# Patient Record
Sex: Male | Born: 1960
Health system: Southern US, Community
[De-identification: ages and names within clinical notes are randomized; demographics above are authoritative.]

## PROBLEM LIST (undated history)

## (undated) DIAGNOSIS — G459 Transient cerebral ischemic attack, unspecified: Secondary | ICD-10-CM

## (undated) DIAGNOSIS — N411 Chronic prostatitis: Secondary | ICD-10-CM

## (undated) DIAGNOSIS — G43909 Migraine, unspecified, not intractable, without status migrainosus: Secondary | ICD-10-CM

## (undated) HISTORY — PX: CHOLECYSTECTOMY: SHX55

## (undated) HISTORY — DX: Chronic prostatitis: N41.1

## (undated) HISTORY — PX: ANTERIOR CRUCIATE LIGAMENT REPAIR: SHX115

## (undated) HISTORY — DX: Transient cerebral ischemic attack, unspecified: G45.9

---

## 2009-06-03 ENCOUNTER — Ambulatory Visit: Payer: Self-pay | Admitting: Family Medicine

## 2009-10-27 ENCOUNTER — Ambulatory Visit: Payer: Self-pay | Admitting: Gastroenterology

## 2009-10-31 LAB — PATHOLOGY REPORT

## 2012-07-04 ENCOUNTER — Ambulatory Visit: Payer: Self-pay | Admitting: Urology

## 2014-06-23 ENCOUNTER — Ambulatory Visit
Admit: 2014-06-23 | Disposition: A | Discharge status: Home / Self Care | Payer: Self-pay | Attending: Family Medicine | Admitting: Family Medicine

## 2014-11-01 ENCOUNTER — Ambulatory Visit (INDEPENDENT_AMBULATORY_CARE_PROVIDER_SITE_OTHER): Payer: BLUE CROSS/BLUE SHIELD | Admitting: Family Medicine

## 2014-11-01 ENCOUNTER — Encounter: Payer: Self-pay | Admitting: Family Medicine

## 2014-11-01 VITALS — BP 121/75 | HR 63 | Temp 97.9°F | Ht 71.0 in | Wt 187.0 lb

## 2014-11-01 DIAGNOSIS — N411 Chronic prostatitis: Secondary | ICD-10-CM

## 2014-11-01 DIAGNOSIS — Z0000 Encounter for general adult medical examination without abnormal findings: Secondary | ICD-10-CM | POA: Diagnosis not present

## 2014-11-01 MED ORDER — VARDENAFIL HCL 20 MG PO TABS: 20.0000 mg | ORAL_TABLET | Freq: Every day | ORAL | Status: DC | PRN

## 2014-11-01 MED ORDER — FINASTERIDE 5 MG PO TABS: 5.0000 mg | ORAL_TABLET | Freq: Every day | ORAL | Status: DC

## 2014-11-01 NOTE — Assessment & Plan Note (Signed)
The current medical regimen is effective;  continue present plan and medications.  

## 2014-11-01 NOTE — Progress Notes (Signed)
   BP 121/75 mmHg  Pulse 63  Temp(Src) 97.9 F (36.6 C)  Ht 5\' 11"  (1.803 m)  Wt 187 lb (84.823 kg)  BMI 26.09 kg/m2  SpO2 99%   Subjective:    Patient ID: Geoffrey Ramirez, male    DOB: Apr 30, 1960, 54 y.o.   MRN: [id number]  HPI: Geoffrey Ramirez is a 54 y.o. male  Chief Complaint  Patient presents with  . Annual Exam   patient for physical has several concerns Concerned about leg circulation symptoms being cold and hair loss. This is been ongoing over the years. Noticed no claudication symptoms or any other symptoms with exertion.  Also has some word finding issues specifically with names. Has otherwise done well with no work problems or deficiencies.  Relevant past medical, surgical, family and social history reviewed and updated as indicated. Interim medical history since our last visit reviewed. Allergies and medications reviewed and updated.  Review of Systems  Constitutional: Negative.   HENT: Negative.   Eyes: Negative.   Respiratory: Negative.   Cardiovascular: Negative.   Endocrine: Negative.   Genitourinary: Negative for decreased urine volume.  Musculoskeletal: Negative.   Skin: Negative.   Allergic/Immunologic: Negative.   Neurological: Negative.   Hematological: Negative.   Psychiatric/Behavioral: Negative.     Per HPI unless specifically indicated above     Objective:    BP 121/75 mmHg  Pulse 63  Temp(Src) 97.9 F (36.6 C)  Ht 5\' 11"  (1.803 m)  Wt 187 lb (84.823 kg)  BMI 26.09 kg/m2  SpO2 99%  Wt Readings from Last 3 Encounters:  11/01/14 187 lb (84.823 kg)  10/27/13 185 lb (83.915 kg)    Physical Exam  Constitutional: He is oriented to person, place, and time. He appears well-developed and well-nourished.  HENT:  Head: Normocephalic and atraumatic.  Right Ear: External ear normal.  Left Ear: External ear normal.  Eyes: Conjunctivae and EOM are normal. Pupils are equal, round, and reactive to light.  Neck: Normal range of motion. Neck  supple.  Cardiovascular: Normal rate, regular rhythm, normal heart sounds and intact distal pulses.   Pulmonary/Chest: Effort normal and breath sounds normal.  Abdominal: Soft. Bowel sounds are normal. There is no splenomegaly or hepatomegaly.  Genitourinary: Rectum normal, prostate normal and penis normal.  Musculoskeletal: Normal range of motion.  Neurological: He is alert and oriented to person, place, and time. He has normal reflexes.  Skin: No rash noted. No erythema.  Psychiatric: He has a normal mood and affect. His behavior is normal. Judgment and thought content normal.        Assessment & Plan:   Problem List Items Addressed This Visit      Genitourinary   Chronic prostatitis    The current medical regimen is effective;  continue present plan and medications.        Other Visit Diagnoses    PE (physical exam), annual    -  Primary      discuss memory issues and observing mostly full plate issues. Leg exam normal with normal pulses will observe symptoms if develops any claudication issues or other changes will further evaluate Reviewed lab work done at work which was normal.  Follow up plan: Return in about 1 year (around 11/01/2015), or if symptoms worsen or fail to improve, for Physical Exam.

## 2014-12-03 ENCOUNTER — Other Ambulatory Visit: Payer: Self-pay | Admitting: Family Medicine

## 2015-07-04 ENCOUNTER — Ambulatory Visit (INDEPENDENT_AMBULATORY_CARE_PROVIDER_SITE_OTHER): Payer: BLUE CROSS/BLUE SHIELD | Admitting: Family Medicine

## 2015-07-04 ENCOUNTER — Encounter: Payer: Self-pay | Admitting: Family Medicine

## 2015-07-04 VITALS — BP 117/84 | HR 61 | Temp 98.8°F | Ht 70.8 in | Wt 185.0 lb

## 2015-07-04 DIAGNOSIS — R079 Chest pain, unspecified: Secondary | ICD-10-CM

## 2015-07-04 NOTE — Progress Notes (Signed)
   BP 117/84 mmHg  Pulse 61  Temp(Src) 98.8 F (37.1 C)  Ht 5' 10.8" (1.798 m)  Wt 185 lb (83.915 kg)  BMI 25.96 kg/m2  SpO2 98%   Subjective:    Patient ID: Geoffrey Ramirez, male    DOB: 1960/12/21, 55 y.o.   MRN: [id number]  HPI: Geoffrey Ramirez is a 55 y.o. male  Chief Complaint  Patient presents with  . chest discomfort    started earlier last week, comes and goes  Patient with some nonspecific chest pain mostly at rest will come and go into the left neck left arm area no nausea vomiting diaphoresis associated did feel a little warm with this not necessarily associated with exertion Plan nurse checking after one of these spells blood pressure was good with no obvious cardiovascular changes or issues. Medication stable with no prostate issues on medications Reviewed cholesterol from last visit in August was essentially normal family history of coronary artery disease and grand father had a heart attack in his late 70s. No other real risk factors. Patient exercises on a regular basis on a treadmill 20-30 minutes a day with no cardiovascular symptoms.  Relevant past medical, surgical, family and social history reviewed and updated as indicated. Interim medical history since our last visit reviewed. Allergies and medications reviewed and updated.  Review of Systems  Constitutional: Negative.   Respiratory: Negative.   Cardiovascular: Positive for chest pain. Negative for palpitations and leg swelling.  Gastrointestinal: Negative.     Per HPI unless specifically indicated above     Objective:    BP 117/84 mmHg  Pulse 61  Temp(Src) 98.8 F (37.1 C)  Ht 5' 10.8" (1.798 m)  Wt 185 lb (83.915 kg)  BMI 25.96 kg/m2  SpO2 98%  Wt Readings from Last 3 Encounters:  07/04/15 185 lb (83.915 kg)  11/01/14 187 lb (84.823 kg)  10/27/13 185 lb (83.915 kg)    Physical Exam  Constitutional: He is oriented to person, place, and time. He appears well-developed and well-nourished. No  distress.  HENT:  Head: Normocephalic and atraumatic.  Right Ear: Hearing normal.  Left Ear: Hearing normal.  Nose: Nose normal.  Eyes: Conjunctivae and lids are normal. Right eye exhibits no discharge. Left eye exhibits no discharge. No scleral icterus.  Cardiovascular: Normal rate, regular rhythm and normal heart sounds.   Pulmonary/Chest: Effort normal and breath sounds normal. No respiratory distress.  Musculoskeletal: Normal range of motion.  Neurological: He is alert and oriented to person, place, and time.  Skin: Skin is intact. No rash noted.  Psychiatric: He has a normal mood and affect. His speech is normal and behavior is normal. Judgment and thought content normal. Cognition and memory are normal.        Assessment & Plan:   Problem List Items Addressed This Visit    None    Visit Diagnoses    Chest pain, unspecified chest pain type    -  Primary    Reviewed chest pain, with modest risk factors, bundle branch block, will refer to cardiology to further evaluate    Relevant Orders    EKG 12-Lead (Completed)    Ambulatory referral to Cardiology    Comprehensive metabolic panel    Lipid panel    CBC with Differential/Platelet    TSH        Follow up plan: Return for As scheduled.

## 2015-07-05 ENCOUNTER — Encounter: Payer: Self-pay | Admitting: Family Medicine

## 2015-07-05 LAB — LIPID PANEL
CHOLESTEROL TOTAL: 188 mg/dL (ref 100–199)
Chol/HDL Ratio: 3.5 ratio units (ref 0.0–5.0)
HDL: 54 mg/dL (ref >39)
LDL Calculated: 112 mg/dL — ABNORMAL HIGH (ref 0–99)
Triglycerides: 112 mg/dL (ref 0–149)
VLDL CHOLESTEROL CAL: 22 mg/dL (ref 5–40)

## 2015-07-05 LAB — COMPREHENSIVE METABOLIC PANEL
ALK PHOS: 58 IU/L (ref 39–117)
ALT: 18 IU/L (ref 0–44)
AST: 14 IU/L (ref 0–40)
Albumin/Globulin Ratio: 1.7 (ref 1.2–2.2)
Albumin: 4.3 g/dL (ref 3.5–5.5)
BUN/Creatinine Ratio: 20 (ref 9–20)
BUN: 16 mg/dL (ref 6–24)
Bilirubin Total: 0.5 mg/dL (ref 0.0–1.2)
CALCIUM: 9.1 mg/dL (ref 8.7–10.2)
CHLORIDE: 102 mmol/L (ref 96–106)
CO2: 24 mmol/L (ref 18–29)
Creatinine, Ser: 0.82 mg/dL (ref 0.76–1.27)
GFR calc Af Amer: 113 mL/min/{1.73_m2} (ref >59)
GFR calc non Af Amer: 98 mL/min/{1.73_m2} (ref >59)
GLUCOSE: 125 mg/dL — AB (ref 65–99)
Globulin, Total: 2.5 g/dL (ref 1.5–4.5)
POTASSIUM: 4.4 mmol/L (ref 3.5–5.2)
Sodium: 141 mmol/L (ref 134–144)
Total Protein: 6.8 g/dL (ref 6.0–8.5)

## 2015-07-05 LAB — CBC WITH DIFFERENTIAL/PLATELET
BASOS: 0 %
Basophils Absolute: 0 10*3/uL (ref 0.0–0.2)
EOS (ABSOLUTE): 0 10*3/uL (ref 0.0–0.4)
EOS: 0 %
HEMATOCRIT: 48.8 % (ref 37.5–51.0)
HEMOGLOBIN: 17 g/dL (ref 12.6–17.7)
Immature Grans (Abs): 0 10*3/uL (ref 0.0–0.1)
Immature Granulocytes: 0 %
Lymphocytes Absolute: 1.9 10*3/uL (ref 0.7–3.1)
Lymphs: 24 %
MCH: 31.1 pg (ref 26.6–33.0)
MCHC: 34.8 g/dL (ref 31.5–35.7)
MCV: 89 fL (ref 79–97)
MONOCYTES: 4 %
MONOS ABS: 0.4 10*3/uL (ref 0.1–0.9)
NEUTROS PCT: 72 %
Neutrophils Absolute: 5.8 10*3/uL (ref 1.4–7.0)
Platelets: 202 10*3/uL (ref 150–379)
RBC: 5.47 x10E6/uL (ref 4.14–5.80)
RDW: 13.4 % (ref 12.3–15.4)
WBC: 8.2 10*3/uL (ref 3.4–10.8)

## 2015-07-05 LAB — TSH: TSH: 1.16 u[IU]/mL (ref 0.450–4.500)

## 2015-07-11 DIAGNOSIS — R079 Chest pain, unspecified: Secondary | ICD-10-CM | POA: Diagnosis not present

## 2015-07-27 DIAGNOSIS — R079 Chest pain, unspecified: Secondary | ICD-10-CM | POA: Diagnosis not present

## 2015-07-27 DIAGNOSIS — E782 Mixed hyperlipidemia: Secondary | ICD-10-CM | POA: Diagnosis not present

## 2015-09-04 ENCOUNTER — Other Ambulatory Visit: Payer: Self-pay | Admitting: Family Medicine

## 2015-10-04 ENCOUNTER — Encounter (INDEPENDENT_AMBULATORY_CARE_PROVIDER_SITE_OTHER): Payer: Self-pay

## 2015-10-24 DIAGNOSIS — D225 Melanocytic nevi of trunk: Secondary | ICD-10-CM | POA: Diagnosis not present

## 2015-11-02 ENCOUNTER — Ambulatory Visit (INDEPENDENT_AMBULATORY_CARE_PROVIDER_SITE_OTHER): Payer: BLUE CROSS/BLUE SHIELD | Admitting: Family Medicine

## 2015-11-02 ENCOUNTER — Encounter: Payer: Self-pay | Admitting: Family Medicine

## 2015-11-02 VITALS — BP 118/75 | HR 67 | Temp 97.9°F | Ht 71.5 in | Wt 187.0 lb

## 2015-11-02 DIAGNOSIS — Z0000 Encounter for general adult medical examination without abnormal findings: Secondary | ICD-10-CM | POA: Diagnosis not present

## 2015-11-02 MED ORDER — VARDENAFIL HCL 20 MG PO TABS: 20.0000 mg | ORAL_TABLET | Freq: Every day | ORAL | 12 refills | Status: DC | PRN

## 2015-11-02 MED ORDER — FINASTERIDE 5 MG PO TABS: 5.0000 mg | ORAL_TABLET | Freq: Every day | ORAL | 12 refills | Status: DC

## 2015-11-02 NOTE — Progress Notes (Signed)
BP 118/75 (BP Location: Left Arm, Patient Position: Sitting, Cuff Size: Normal)   Pulse 67   Temp 97.9 F (36.6 C)   Ht 5' 11.5" (1.816 m)   Wt 187 lb (84.8 kg)   SpO2 97%   BMI 25.72 kg/m    Subjective:    Patient ID: Geoffrey Ramirez, male    DOB: [birth year], 55 y.o.   MRN: [id number]  HPI: Geoffrey Ramirez is a 55 y.o. male  Chief Complaint  Patient presents with  . Annual Exam   Patient all in all doing well having some issues with especially Sunday night waking up at 3 AM with his mind spinning and can go back to sleep. Also having some time early right heel somewhat in his left heel also first step in the morning pain gets a little better during the day but often constantly is uncomfortable. But more uncomfortable with walking especially after rest. Prostate no specific complaints doing well no change.  Relevant past medical, surgical, family and social history reviewed and updated as indicated. Interim medical history since our last visit reviewed. Allergies and medications reviewed and updated.  Review of Systems  Constitutional: Negative.   HENT: Negative.   Eyes: Negative.   Respiratory: Negative.   Cardiovascular: Negative.   Gastrointestinal: Negative.   Endocrine: Negative.   Genitourinary: Negative.   Musculoskeletal: Negative.   Skin: Negative.   Allergic/Immunologic: Negative.   Neurological: Negative.   Hematological: Negative.   Psychiatric/Behavioral: Negative.     Per HPI unless specifically indicated above     Objective:    BP 118/75 (BP Location: Left Arm, Patient Position: Sitting, Cuff Size: Normal)   Pulse 67   Temp 97.9 F (36.6 C)   Ht 5' 11.5" (1.816 m)   Wt 187 lb (84.8 kg)   SpO2 97%   BMI 25.72 kg/m   Wt Readings from Last 3 Encounters:  11/02/15 187 lb (84.8 kg)  07/04/15 185 lb (83.9 kg)  11/01/14 187 lb (84.8 kg)    Physical Exam  Constitutional: He is oriented to person, place, and time. He appears well-developed and  well-nourished.  HENT:  Head: Normocephalic and atraumatic.  Right Ear: External ear normal.  Left Ear: External ear normal.  Eyes: Conjunctivae and EOM are normal. Pupils are equal, round, and reactive to light.  Neck: Normal range of motion. Neck supple.  Cardiovascular: Normal rate, regular rhythm, normal heart sounds and intact distal pulses.   Pulmonary/Chest: Effort normal and breath sounds normal.  Abdominal: Soft. Bowel sounds are normal. There is no splenomegaly or hepatomegaly.  Genitourinary: Rectum normal, prostate normal and penis normal.  Musculoskeletal: Normal range of motion.  Neurological: He is alert and oriented to person, place, and time. He has normal reflexes.  Skin: No rash noted. No erythema.  Psychiatric: He has a normal mood and affect. His behavior is normal. Judgment and thought content normal.    Results for orders placed or performed in visit on 07/04/15  Comprehensive metabolic panel  Result Value Ref Range   Glucose 125 (H) 65 - 99 mg/dL   BUN 16 6 - 24 mg/dL   Creatinine, Ser 0.82 0.76 - 1.27 mg/dL   GFR calc non Af Amer 98 >59 mL/min/1.73   GFR calc Af Amer 113 >59 mL/min/1.73   BUN/Creatinine Ratio 20 9 - 20   Sodium 141 134 - 144 mmol/L   Potassium 4.4 3.5 - 5.2 mmol/L   Chloride 102 96 - 106 mmol/L  CO2 24 18 - 29 mmol/L   Calcium 9.1 8.7 - 10.2 mg/dL   Total Protein 6.8 6.0 - 8.5 g/dL   Albumin 4.3 3.5 - 5.5 g/dL   Globulin, Total 2.5 1.5 - 4.5 g/dL   Albumin/Globulin Ratio 1.7 1.2 - 2.2   Bilirubin Total 0.5 0.0 - 1.2 mg/dL   Alkaline Phosphatase 58 39 - 117 IU/L   AST 14 0 - 40 IU/L   ALT 18 0 - 44 IU/L  Lipid panel  Result Value Ref Range   Cholesterol, Total 188 100 - 199 mg/dL   Triglycerides 112 0 - 149 mg/dL   HDL 54 >39 mg/dL   VLDL Cholesterol Cal 22 5 - 40 mg/dL   LDL Calculated 112 (H) 0 - 99 mg/dL   Chol/HDL Ratio 3.5 0.0 - 5.0 ratio units  CBC with Differential/Platelet  Result Value Ref Range   WBC 8.2 3.4 - 10.8  x10E3/uL   RBC 5.47 4.14 - 5.80 x10E6/uL   Hemoglobin 17.0 12.6 - 17.7 g/dL   Hematocrit 48.8 37.5 - 51.0 %   MCV 89 79 - 97 fL   MCH 31.1 26.6 - 33.0 pg   MCHC 34.8 31.5 - 35.7 g/dL   RDW 13.4 12.3 - 15.4 %   Platelets 202 150 - 379 x10E3/uL   Neutrophils 72 %   Lymphs 24 %   Monocytes 4 %   Eos 0 %   Basos 0 %   Neutrophils Absolute 5.8 1.4 - 7.0 x10E3/uL   Lymphocytes Absolute 1.9 0.7 - 3.1 x10E3/uL   Monocytes Absolute 0.4 0.1 - 0.9 x10E3/uL   EOS (ABSOLUTE) 0.0 0.0 - 0.4 x10E3/uL   Basophils Absolute 0.0 0.0 - 0.2 x10E3/uL   Immature Granulocytes 0 %   Immature Grans (Abs) 0.0 0.0 - 0.1 x10E3/uL  TSH  Result Value Ref Range   TSH 1.160 0.450 - 4.500 uIU/mL      Assessment & Plan:   Problem List Items Addressed This Visit    None    Visit Diagnoses    PE (physical exam), annual    -  Primary       Follow up plan: Return in about 1 year (around 11/01/2016), or if symptoms worsen or fail to improve, for Physical Exam.

## 2015-11-29 ENCOUNTER — Encounter: Payer: Self-pay | Admitting: Podiatry

## 2015-11-29 ENCOUNTER — Ambulatory Visit (INDEPENDENT_AMBULATORY_CARE_PROVIDER_SITE_OTHER): Payer: BLUE CROSS/BLUE SHIELD

## 2015-11-29 ENCOUNTER — Ambulatory Visit (INDEPENDENT_AMBULATORY_CARE_PROVIDER_SITE_OTHER): Payer: BLUE CROSS/BLUE SHIELD | Admitting: Podiatry

## 2015-11-29 VITALS — BP 135/81 | HR 68 | Resp 16 | Ht 72.0 in | Wt 183.0 lb

## 2015-11-29 DIAGNOSIS — M7752 Other enthesopathy of left foot: Secondary | ICD-10-CM

## 2015-11-29 DIAGNOSIS — M79672 Pain in left foot: Secondary | ICD-10-CM

## 2015-11-29 DIAGNOSIS — M79671 Pain in right foot: Secondary | ICD-10-CM

## 2015-11-29 DIAGNOSIS — G5762 Lesion of plantar nerve, left lower limb: Secondary | ICD-10-CM

## 2015-11-29 DIAGNOSIS — M778 Other enthesopathies, not elsewhere classified: Secondary | ICD-10-CM

## 2015-11-29 DIAGNOSIS — M722 Plantar fascial fibromatosis: Secondary | ICD-10-CM

## 2015-11-29 DIAGNOSIS — M779 Enthesopathy, unspecified: Secondary | ICD-10-CM

## 2015-11-29 DIAGNOSIS — G5792 Unspecified mononeuropathy of left lower limb: Secondary | ICD-10-CM | POA: Diagnosis not present

## 2015-11-29 MED ORDER — IBUPROFEN 600 MG PO TABS: 600.0000 mg | ORAL_TABLET | Freq: Three times a day (TID) | ORAL | 1 refills | Status: DC | PRN

## 2015-11-29 MED ORDER — BETAMETHASONE SOD PHOS & ACET 6 (3-3) MG/ML IJ SUSP: 12.0000 mg | Freq: Once | INTRAMUSCULAR | Status: DC

## 2015-11-29 NOTE — Progress Notes (Signed)
Patient ID: Geoffrey Ramirez, male   DOB: 30-Mar-1960, 54 y.o.   MRN: [id number] Subjective: 55 year old male healthy presents to the office for right heel pain in left forefoot pain. Patient states the pains been going on and off for the last 3 years however pain has been exacerbated over the past 3 weeks. Patient works at a [employer] and is on his feet all day long for work.  Objective: Physical Exam General: The patient is alert and oriented x3 in no acute distress.  Dermatology: Skin is warm, dry and supple bilateral lower extremities. Negative for open lesions or macerations bilateral.   Vascular: Dorsalis Pedis and Posterior Tibial pulses palpable bilateral.  Capillary fill time is immediate to all digits.  Neurological: Positive Biagio Borg sign noted to the left forefoot upon compression of the metatarsal heads in the fourth interspace. Epicritic and protective threshold intact bilateral.   Musculoskeletal: Tenderness to palpation at the medial calcaneal tubercale and through the insertion of the plantar fascia of the right foot. All other joints range of motion within normal limits bilateral. Strength 5/5 in all groups bilateral.   Radiographic exam:   Normal osseous mineralization. Joint spaces preserved. No fracture/dislocation/boney destruction. Calcaneal spur present with mild thickening of plantar fascia right. No other soft tissue abnormalities or radiopaque foreign bodies.   Assessment: #1 plantar fasciitis right foot #2 capsulitis left foot fourth MPJ #3 neuritis left foot #4 Morton's neuroma fourth interspace left foot #5 pain in bilateral feet  Problem List Items Addressed This Visit    None    Visit Diagnoses    Foot pain, bilateral    -  Primary   Relevant Orders   DG Foot Complete Right   DG Foot Complete Left   Plantar fasciitis of right foot       Capsulitis of left foot       Neuritis of left foot       Morton neuroma, left           Plan of Care:    1. Patient evaluated. Xrays reviewed.   2. Injection of 0.5cc Celestone soluspan injected into the right heel.  3. Instructed patient regarding therapies and modalities at home to alleviate symptoms.  4. Rx for motrin 800 given to patient.  5. Plantar fascial band(s) dispensed for right foot. 6. Today we skin the patient for custom molded orthotics due to the chronic nature of the plantar fasciitis and the fact that he works on his feet on concrete floors all day long. 7. Return to clinic in 4 weeks for reevaluation and orthotic pickup.Marland Kitchen

## 2015-11-29 NOTE — Progress Notes (Signed)
   Subjective:    Patient ID: Geoffrey Ramirez, male    DOB: 1961/02/02, 55 y.o.   MRN: [id number]  HPI    Review of Systems  All other systems reviewed and are negative.      Objective:   Physical Exam        Assessment & Plan:

## 2015-11-29 NOTE — Patient Instructions (Signed)

## 2015-12-02 ENCOUNTER — Ambulatory Visit: Payer: Self-pay | Admitting: Podiatry

## 2015-12-27 ENCOUNTER — Ambulatory Visit (INDEPENDENT_AMBULATORY_CARE_PROVIDER_SITE_OTHER): Payer: BLUE CROSS/BLUE SHIELD | Admitting: Podiatry

## 2015-12-27 DIAGNOSIS — M779 Enthesopathy, unspecified: Secondary | ICD-10-CM

## 2015-12-27 DIAGNOSIS — M778 Other enthesopathies, not elsewhere classified: Secondary | ICD-10-CM

## 2015-12-27 DIAGNOSIS — M79672 Pain in left foot: Secondary | ICD-10-CM | POA: Diagnosis not present

## 2015-12-27 DIAGNOSIS — M722 Plantar fascial fibromatosis: Secondary | ICD-10-CM | POA: Diagnosis not present

## 2015-12-27 DIAGNOSIS — G5762 Lesion of plantar nerve, left lower limb: Secondary | ICD-10-CM

## 2015-12-27 DIAGNOSIS — M79671 Pain in right foot: Secondary | ICD-10-CM

## 2015-12-27 DIAGNOSIS — M7752 Other enthesopathy of left foot: Secondary | ICD-10-CM | POA: Diagnosis not present

## 2015-12-27 DIAGNOSIS — G5792 Unspecified mononeuropathy of left lower limb: Secondary | ICD-10-CM | POA: Diagnosis not present

## 2015-12-27 NOTE — Patient Instructions (Signed)

## 2015-12-28 NOTE — Progress Notes (Signed)
Subjective:  Patient presents today for follow-up evaluation of plantar fasciitis to the right foot and capsulitis to the left fourth MPJ. Patient also has a Morton's neuroma to the fourth interspace left foot. Patient presents today for orthotic pickup and for follow-up evaluation.    Objective/Physical Exam General: The patient is alert and oriented x3 in no acute distress.  Dermatology: Skin is warm, dry and supple bilateral lower extremities. Negative for open lesions or macerations.  Vascular: Palpable pedal pulses bilaterally. No edema or erythema noted. Capillary refill within normal limits.  Neurological: Epicritic and protective threshold grossly intact bilaterally.   Musculoskeletal Exam: Tenderness to palpation at the medial calcaneal tubercle and through the insertion of the plantar fascia of the right foot. Positive Biagio Borg sign noted to the left forefoot upon compression of the metatarsal heads in the fourth interspace. Range of motion within normal limits to all pedal and ankle joints bilateral. Muscle strength 5/5 in all groups bilateral.   Radiographic Exam:  Normal osseous mineralization. Joint spaces preserved. No fracture/dislocation/boney destruction.    Assessment: #1 plantar fasciitis right foot #2 capsulitis left foot fourth MPJ #3 neuritis left foot #4 Morton's neuroma fourth interspace left foot #5 pain in bilateral feet   Plan of Care:  #1 Patient was evaluated. #2 today the patient received his orthotics with instructions for care and break-in period. #2 continue anti-inflammatory pain cream through [employer]. #3 patient's wife is a physical therapist. Recommend physical therapy 3 times per week 4 weeks. #4 patient is to return to clinic in 4 weeks   Dr. Edrick Kins, [place]

## 2015-12-29 MED ORDER — NONFORMULARY OR COMPOUNDED ITEM: 1.0000 g | Freq: Four times a day (QID) | 2 refills | Status: DC

## 2015-12-29 NOTE — Addendum Note (Signed)
Addended by: Johnnye Lana A on: 12/29/2015 06:27 PM   Modules accepted: Orders

## 2016-01-24 ENCOUNTER — Ambulatory Visit: Payer: BLUE CROSS/BLUE SHIELD | Admitting: Podiatry

## 2016-05-11 ENCOUNTER — Ambulatory Visit (INDEPENDENT_AMBULATORY_CARE_PROVIDER_SITE_OTHER): Payer: BLUE CROSS/BLUE SHIELD | Admitting: Family Medicine

## 2016-05-11 ENCOUNTER — Encounter: Payer: Self-pay | Admitting: Family Medicine

## 2016-05-11 VITALS — BP 131/79 | HR 69 | Temp 98.6°F | Wt 188.0 lb

## 2016-05-11 DIAGNOSIS — J309 Allergic rhinitis, unspecified: Secondary | ICD-10-CM

## 2016-05-11 MED ORDER — MONTELUKAST SODIUM 10 MG PO TABS: 10.0000 mg | ORAL_TABLET | Freq: Every day | ORAL | 3 refills | Status: DC

## 2016-05-11 NOTE — Progress Notes (Signed)
   BP 131/79   Pulse 69   Temp 98.6 F (37 C)   Wt 188 lb (85.3 kg)   SpO2 97%   BMI 25.50 kg/m    Subjective:    Patient ID: Geoffrey Ramirez, male    DOB: 09-Sep-1960, 56 y.o.   MRN: [id number]  HPI: [name] is a 56 y.o. male  Chief Complaint  Patient presents with  . Sinusitis    last Thursday started having discomfort in throat, was given Augmentin. It is improving. Some head congestion, some cough, some sinus drainage,, slight runny nose. no sore throat, no fever.   Patient presents for sore throat that seems to be improving the past day. Also having some rhinorrhea, sneezing, dry cough. Denies fever, chills,CP, SOB. Saw his employee health nurse who gave him Augmentin and he is halfway through that course. Does have hx of seasonal allergies, not on any medications because zyrtec makes him sleepy.   Relevant past medical, surgical, family and social history reviewed and updated as indicated. Interim medical history since our last visit reviewed. Allergies and medications reviewed and updated.  Review of Systems  Constitutional: Negative.   HENT: Positive for rhinorrhea, sneezing and sore throat.   Respiratory: Positive for cough.   Cardiovascular: Negative.   Gastrointestinal: Negative.   Genitourinary: Negative.   Musculoskeletal: Negative.   Neurological: Negative.   Psychiatric/Behavioral: Negative.     Per HPI unless specifically indicated above     Objective:    BP 131/79   Pulse 69   Temp 98.6 F (37 C)   Wt 188 lb (85.3 kg)   SpO2 97%   BMI 25.50 kg/m   Wt Readings from Last 3 Encounters:  05/11/16 188 lb (85.3 kg)  11/29/15 183 lb (83 kg)  11/02/15 187 lb (84.8 kg)    Physical Exam  Constitutional: He is oriented to person, place, and time. He appears well-developed and well-nourished.  HENT:  Head: Atraumatic.  Right Ear: External ear normal.  Left Ear: External ear normal.  Posterior oropharynx mildly erythematous Nasal mucosa injected    Eyes: Conjunctivae are normal. Pupils are equal, round, and reactive to light.  Neck: Normal range of motion. Neck supple.  Cardiovascular: Normal rate and normal heart sounds.   Pulmonary/Chest: Effort normal and breath sounds normal. No respiratory distress.  Musculoskeletal: Normal range of motion.  Neurological: He is alert and oriented to person, place, and time.  Skin: Skin is warm and dry.  Psychiatric: He has a normal mood and affect. His behavior is normal.  Nursing note and vitals reviewed.     Assessment & Plan:   Problem List Items Addressed This Visit    None    Visit Diagnoses    Chronic allergic rhinitis, unspecified seasonality, unspecified trigger    -  Primary   Suspect symptoms are related to allergies. Singulair sent, recommended flonase once to twice daily. Follow up if worsening or no improvement       Follow up plan: Return if symptoms worsen or fail to improve.

## 2016-05-11 NOTE — Patient Instructions (Signed)
Follow up as needed

## 2016-06-08 ENCOUNTER — Other Ambulatory Visit: Payer: Self-pay | Admitting: Family Medicine

## 2016-07-02 DIAGNOSIS — E782 Mixed hyperlipidemia: Secondary | ICD-10-CM | POA: Diagnosis not present

## 2016-07-02 DIAGNOSIS — I208 Other forms of angina pectoris: Secondary | ICD-10-CM | POA: Diagnosis not present

## 2016-07-02 DIAGNOSIS — R079 Chest pain, unspecified: Secondary | ICD-10-CM | POA: Diagnosis not present

## 2016-07-03 DIAGNOSIS — I208 Other forms of angina pectoris: Secondary | ICD-10-CM | POA: Diagnosis not present

## 2016-07-03 DIAGNOSIS — I10 Essential (primary) hypertension: Secondary | ICD-10-CM | POA: Diagnosis not present

## 2016-07-03 DIAGNOSIS — E782 Mixed hyperlipidemia: Secondary | ICD-10-CM | POA: Diagnosis not present

## 2016-08-09 DIAGNOSIS — L820 Inflamed seborrheic keratosis: Secondary | ICD-10-CM | POA: Diagnosis not present

## 2016-09-11 ENCOUNTER — Encounter: Payer: Self-pay | Admitting: Family Medicine

## 2016-09-24 ENCOUNTER — Other Ambulatory Visit: Payer: Self-pay | Admitting: Family Medicine

## 2016-09-24 NOTE — Telephone Encounter (Signed)
Routing to provider. Appt on 11/19/16. 

## 2016-10-15 DIAGNOSIS — D2371 Other benign neoplasm of skin of right lower limb, including hip: Secondary | ICD-10-CM | POA: Diagnosis not present

## 2016-11-06 ENCOUNTER — Encounter: Payer: BLUE CROSS/BLUE SHIELD | Admitting: Family Medicine

## 2016-11-19 ENCOUNTER — Ambulatory Visit (INDEPENDENT_AMBULATORY_CARE_PROVIDER_SITE_OTHER): Payer: BLUE CROSS/BLUE SHIELD | Admitting: Family Medicine

## 2016-11-19 ENCOUNTER — Encounter: Payer: Self-pay | Admitting: Family Medicine

## 2016-11-19 ENCOUNTER — Encounter: Payer: BLUE CROSS/BLUE SHIELD | Admitting: Family Medicine

## 2016-11-19 VITALS — BP 125/83 | HR 63 | Ht 72.25 in | Wt 191.0 lb

## 2016-11-19 DIAGNOSIS — Z13220 Encounter for screening for lipoid disorders: Secondary | ICD-10-CM | POA: Diagnosis not present

## 2016-11-19 DIAGNOSIS — Z23 Encounter for immunization: Secondary | ICD-10-CM

## 2016-11-19 DIAGNOSIS — Z114 Encounter for screening for human immunodeficiency virus [HIV]: Secondary | ICD-10-CM | POA: Diagnosis not present

## 2016-11-19 DIAGNOSIS — M722 Plantar fascial fibromatosis: Secondary | ICD-10-CM | POA: Insufficient documentation

## 2016-11-19 DIAGNOSIS — Z1329 Encounter for screening for other suspected endocrine disorder: Secondary | ICD-10-CM | POA: Diagnosis not present

## 2016-11-19 DIAGNOSIS — Z1159 Encounter for screening for other viral diseases: Secondary | ICD-10-CM

## 2016-11-19 DIAGNOSIS — Z0000 Encounter for general adult medical examination without abnormal findings: Secondary | ICD-10-CM

## 2016-11-19 DIAGNOSIS — Z125 Encounter for screening for malignant neoplasm of prostate: Secondary | ICD-10-CM

## 2016-11-19 DIAGNOSIS — G43109 Migraine with aura, not intractable, without status migrainosus: Secondary | ICD-10-CM

## 2016-11-19 DIAGNOSIS — Z131 Encounter for screening for diabetes mellitus: Secondary | ICD-10-CM | POA: Diagnosis not present

## 2016-11-19 MED ORDER — FINASTERIDE 5 MG PO TABS: 5.0000 mg | ORAL_TABLET | Freq: Every day | ORAL | 12 refills | Status: DC

## 2016-11-19 MED ORDER — SUMATRIPTAN SUCCINATE 100 MG PO TABS: 100.0000 mg | ORAL_TABLET | ORAL | 12 refills | Status: DC | PRN

## 2016-11-19 MED ORDER — VARDENAFIL HCL 20 MG PO TABS: 20.0000 mg | ORAL_TABLET | Freq: Every day | ORAL | 12 refills | Status: DC | PRN

## 2016-11-19 NOTE — Assessment & Plan Note (Signed)
Discuss use of Imitrex

## 2016-11-19 NOTE — Assessment & Plan Note (Signed)
Reviewed care and treatment items for plantar fasciitis which patient is mostly already done.

## 2016-11-19 NOTE — Progress Notes (Signed)
BP 125/83   Pulse 63   Ht 6' 0.25" (1.835 m)   Wt 191 lb (86.6 kg)   SpO2 98%   BMI 25.73 kg/m    Subjective:    Patient ID: Geoffrey Ramirez, male    DOB: 1960/04/06, 56 y.o.   MRN: [id number]  HPI: Geoffrey Ramirez is a 56 y.o. male presenting on 11/19/2016 for comprehensive medical examination. Current medical complaints include Several concerns has had chronic plantar fasciitis comes and goes has had multiple care attempts with injections inserts etc. still plagued by intermittent plantar pain manifested by first step after sitting for some time. Also has intermittent migraine headaches once to try some medicine for Also having early morning wakening 4 AM unable to really go back to sleep. Is able to sleep just fine prior to that. He currently lives with: Interim Problems from his last visit: no  Depression Screen done today and results listed below:  Depression screen [facility] 2/9 11/19/2016 11/02/2015  Decreased Interest 0 0  Down, Depressed, Hopeless 0 0  PHQ - 2 Score 0 0  Altered sleeping - 1  Tired, decreased energy - 0  Change in appetite - 0  Feeling bad or failure about yourself  - 0  Trouble concentrating - 0  Moving slowly or fidgety/restless - 0  Suicidal thoughts - 0  PHQ-9 Score - 1       Past Medical History:  Past Medical History:  Diagnosis Date  . Chronic prostatitis     Surgical History:  Past Surgical History:  Procedure Laterality Date  . ANTERIOR CRUCIATE LIGAMENT REPAIR    . CHOLECYSTECTOMY      Medications:  Current Outpatient Prescriptions on File Prior to Visit  Medication Sig  . aspirin EC 81 MG tablet Take 81 mg by mouth daily.  . Cholecalciferol (VITAMIN D) 2000 units CAPS Take 400 Units by mouth daily.  . finasteride (PROSCAR) 5 MG tablet TAKE 1 TABLET (5 MG TOTAL) BY MOUTH DAILY.  Marland Kitchen ibuprofen (ADVIL,MOTRIN) 600 MG tablet Take 1 tablet (600 mg total) by mouth every 8 (eight) hours as needed.  Marland Kitchen LEVITRA 20 MG tablet TAKE 1 TABLET (20 MG  TOTAL) BY MOUTH DAILY AS NEEDED FOR ERECTILE DYSFUNCTION.  . montelukast (SINGULAIR) 10 MG tablet Take 1 tablet (10 mg total) by mouth at bedtime.  . Multiple Vitamins-Minerals (MULTIVITAMIN ADULT PO) Take by mouth daily.  . NONFORMULARY OR COMPOUNDED ITEM Apply 1-2 g topically 4 (four) times daily.  . Omega-3 Fatty Acids (FISH OIL) 1000 MG CAPS Take 2,000 mg by mouth daily.   Current Facility-Administered Medications on File Prior to Visit  Medication  . betamethasone acetate-betamethasone sodium phosphate (CELESTONE) injection 12 mg    Allergies:  No Known Allergies  Social History:  Social History   Social History  . Marital status: Married    Spouse name: N/A  . Number of children: N/A  . Years of education: N/A   Occupational History  . Not on file.   Social History Main Topics  . Smoking status: Never Smoker  . Smokeless tobacco: Never Used  . Alcohol use Yes     Comment: rare  . Drug use: No  . Sexual activity: Not on file   Other Topics Concern  . Not on file   Social History Narrative  . No narrative on file   History  Smoking Status  . Never Smoker  Smokeless Tobacco  . Never Used   History  Alcohol Use  .  Yes    Comment: rare    Family History:  Family History  Problem Relation Age of Onset  . Cancer Mother   . Mental illness Mother   . Diabetes Father   . Cancer Maternal Grandmother   . Diabetes Maternal Grandfather   . Heart disease Maternal Grandfather   . Stroke Maternal Grandfather   . Glaucoma Maternal Grandfather     Past medical history, surgical history, medications, allergies, family history and social history reviewed with patient today and changes made to appropriate areas of the chart.   Review of Systems -  All other ROS negative except what is listed above and in the HPI.      Objective:    BP 125/83   Pulse 63   Ht 6' 0.25" (1.835 m)   Wt 191 lb (86.6 kg)   SpO2 98%   BMI 25.73 kg/m   Wt Readings from Last 3  Encounters:  11/19/16 191 lb (86.6 kg)  05/11/16 188 lb (85.3 kg)  11/29/15 183 lb (83 kg)    Physical Exam  Constitutional: He is oriented to person, place, and time. He appears well-developed and well-nourished.  HENT:  Head: Normocephalic and atraumatic.  Right Ear: External ear normal.  Left Ear: External ear normal.  Eyes: Pupils are equal, round, and reactive to light. Conjunctivae and EOM are normal.  Neck: Normal range of motion. Neck supple.  Cardiovascular: Normal rate, regular rhythm, normal heart sounds and intact distal pulses.   Pulmonary/Chest: Effort normal and breath sounds normal.  Abdominal: Soft. Bowel sounds are normal. There is no splenomegaly or hepatomegaly.  Genitourinary: Rectum normal, prostate normal and penis normal.  Musculoskeletal: Normal range of motion.  Neurological: He is alert and oriented to person, place, and time. He has normal reflexes.  Skin: No rash noted. No erythema.  Psychiatric: He has a normal mood and affect. His behavior is normal. Judgment and thought content normal.    Results for orders placed or performed in visit on 07/04/15  Comprehensive metabolic panel  Result Value Ref Range   Glucose 125 (H) 65 - 99 mg/dL   BUN 16 6 - 24 mg/dL   Creatinine, Ser 0.82 0.76 - 1.27 mg/dL   GFR calc non Af Amer 98 >59 mL/min/1.73   GFR calc Af Amer 113 >59 mL/min/1.73   BUN/Creatinine Ratio 20 9 - 20   Sodium 141 134 - 144 mmol/L   Potassium 4.4 3.5 - 5.2 mmol/L   Chloride 102 96 - 106 mmol/L   CO2 24 18 - 29 mmol/L   Calcium 9.1 8.7 - 10.2 mg/dL   Total Protein 6.8 6.0 - 8.5 g/dL   Albumin 4.3 3.5 - 5.5 g/dL   Globulin, Total 2.5 1.5 - 4.5 g/dL   Albumin/Globulin Ratio 1.7 1.2 - 2.2   Bilirubin Total 0.5 0.0 - 1.2 mg/dL   Alkaline Phosphatase 58 39 - 117 IU/L   AST 14 0 - 40 IU/L   ALT 18 0 - 44 IU/L  Lipid panel  Result Value Ref Range   Cholesterol, Total 188 100 - 199 mg/dL   Triglycerides 112 0 - 149 mg/dL   HDL 54 >39 mg/dL     VLDL Cholesterol Cal 22 5 - 40 mg/dL   LDL Calculated 112 (H) 0 - 99 mg/dL   Chol/HDL Ratio 3.5 0.0 - 5.0 ratio units  CBC with Differential/Platelet  Result Value Ref Range   WBC 8.2 3.4 - 10.8 x10E3/uL   RBC 5.47  4.14 - 5.80 x10E6/uL   Hemoglobin 17.0 12.6 - 17.7 g/dL   Hematocrit 48.8 37.5 - 51.0 %   MCV 89 79 - 97 fL   MCH 31.1 26.6 - 33.0 pg   MCHC 34.8 31.5 - 35.7 g/dL   RDW 13.4 12.3 - 15.4 %   Platelets 202 150 - 379 x10E3/uL   Neutrophils 72 %   Lymphs 24 %   Monocytes 4 %   Eos 0 %   Basos 0 %   Neutrophils Absolute 5.8 1.4 - 7.0 x10E3/uL   Lymphocytes Absolute 1.9 0.7 - 3.1 x10E3/uL   Monocytes Absolute 0.4 0.1 - 0.9 x10E3/uL   EOS (ABSOLUTE) 0.0 0.0 - 0.4 x10E3/uL   Basophils Absolute 0.0 0.0 - 0.2 x10E3/uL   Immature Granulocytes 0 %   Immature Grans (Abs) 0.0 0.0 - 0.1 x10E3/uL  TSH  Result Value Ref Range   TSH 1.160 0.450 - 4.500 uIU/mL      Assessment & Plan:   Problem List Items Addressed This Visit    None    Visit Diagnoses    Annual physical exam    -  Primary   Encounter for screening for HIV       Need for hepatitis C screening test       Needs flu shot       Screening for diabetes mellitus (DM)       Screening cholesterol level       Prostate cancer screening       Thyroid disorder screen           Discussed aspirin prophylaxis for myocardial infarction prevention and decision was made to continue ASA  LABORATORY TESTING:  Health maintenance labs ordered today as discussed above.   The natural history of prostate cancer and ongoing controversy regarding screening and potential treatment outcomes of prostate cancer has been discussed with the patient. The meaning of a false positive PSA and a false negative PSA has been discussed. He indicates understanding of the limitations of this screening test and wishes  to proceed with screening PSA testing.   IMMUNIZATIONS:   - Tdap: Tetanus vaccination status reviewed: last tetanus booster  within 10 years. - Influenza: Refused - Pneumovax: Not applicable - Prevnar: Not applicable - Zostavax vaccine: Not applicable  SCREENING: - Colonoscopy: Up to date  Discussed with patient purpose of the colonoscopy is to detect colon cancer at curable precancerous or early stages   - AAA Screening: Not applicable  -Hearing Test: Not applicable  -Spirometry: Not applicable   PATIENT COUNSELING:    Sexuality: Discussed sexually transmitted diseases, partner selection, use of condoms, avoidance of unintended pregnancy  and contraceptive alternatives.   Advised to avoid cigarette smoking.  I discussed with the patient that most people either abstain from alcohol or drink within safe limits (<=14/week and <=4 drinks/occasion for males, <=7/weeks and <= 3 drinks/occasion for females) and that the risk for alcohol disorders and other health effects rises proportionally with the number of drinks per week and how often a drinker exceeds daily limits.  Discussed cessation/primary prevention of drug use and availability of treatment for abuse.   Diet: Encouraged to adjust caloric intake to maintain  or achieve ideal body weight, to reduce intake of dietary saturated fat and total fat, to limit sodium intake by avoiding high sodium foods and not adding table salt, and to maintain adequate dietary potassium and calcium preferably from fresh fruits, vegetables, and low-fat dairy products.  stressed the importance of regular exercise  Injury prevention: Discussed safety belts, safety helmets, smoke detector, smoking near bedding or upholstery.   Dental health: Discussed importance of regular tooth brushing, flossing, and dental visits.   Follow up plan: NEXT PREVENTATIVE PHYSICAL DUE IN 1 YEAR. No Follow-up on file.

## 2017-01-01 DIAGNOSIS — E782 Mixed hyperlipidemia: Secondary | ICD-10-CM | POA: Diagnosis not present

## 2017-05-21 LAB — BASIC METABOLIC PANEL
BUN: 13 (ref 4–21)
CREATININE: 0.9 (ref 0.6–1.3)
Glucose: 119
POTASSIUM: 4.9 (ref 3.4–5.3)
SODIUM: 141 (ref 137–147)

## 2017-05-21 LAB — IRON,TIBC AND FERRITIN PANEL: Iron: 111

## 2017-05-21 LAB — TSH: TSH: 1.86 (ref 0.41–5.90)

## 2017-05-21 LAB — HEPATIC FUNCTION PANEL
ALK PHOS: 69 (ref 25–125)
ALT: 21 (ref 10–40)
AST: 23 (ref 14–40)
Bilirubin, Total: 0.7

## 2017-05-21 LAB — CBC AND DIFFERENTIAL
Hemoglobin: 5.6 — AB (ref 13.5–17.5)
Neutrophils Absolute: 6
WBC: 8.7

## 2017-05-21 LAB — LIPID PANEL
Cholesterol: 196 (ref 0–200)
HDL: 54 (ref 35–70)
LDL CALC: 124
TRIGLYCERIDES: 89 (ref 40–160)

## 2017-05-21 LAB — VITAMIN D 25 HYDROXY (VIT D DEFICIENCY, FRACTURES): VIT D 25 HYDROXY: 35.4

## 2017-06-10 ENCOUNTER — Encounter: Payer: Self-pay | Admitting: Family Medicine

## 2017-09-19 ENCOUNTER — Encounter: Payer: Self-pay | Admitting: Family Medicine

## 2017-10-14 DIAGNOSIS — L218 Other seborrheic dermatitis: Secondary | ICD-10-CM | POA: Diagnosis not present

## 2017-10-14 DIAGNOSIS — D2371 Other benign neoplasm of skin of right lower limb, including hip: Secondary | ICD-10-CM | POA: Diagnosis not present

## 2017-11-21 ENCOUNTER — Encounter: Payer: BLUE CROSS/BLUE SHIELD | Admitting: Family Medicine

## 2017-11-29 ENCOUNTER — Encounter: Payer: Self-pay | Admitting: Family Medicine

## 2017-11-29 ENCOUNTER — Ambulatory Visit: Payer: BLUE CROSS/BLUE SHIELD | Admitting: Family Medicine

## 2017-11-29 VITALS — BP 118/79 | HR 65 | Wt 191.5 lb

## 2017-11-29 DIAGNOSIS — R51 Headache: Secondary | ICD-10-CM | POA: Diagnosis not present

## 2017-11-29 DIAGNOSIS — G43109 Migraine with aura, not intractable, without status migrainosus: Secondary | ICD-10-CM

## 2017-11-29 DIAGNOSIS — R519 Headache, unspecified: Secondary | ICD-10-CM

## 2017-11-29 MED ORDER — SUMATRIPTAN SUCCINATE 100 MG PO TABS: 100.0000 mg | ORAL_TABLET | ORAL | 12 refills | Status: DC | PRN

## 2017-11-29 NOTE — Assessment & Plan Note (Signed)
Migraines have been getting significantly worse, increasing from at most 1 a year to 3-4 in the last couple of months. Given changes in his migraines, will get him MRI- if normal, will consider starting preventative medicine. Await results. Follow up with PCP as needed. Call with any concerns.

## 2017-11-29 NOTE — Progress Notes (Signed)
BP 118/79 (BP Location: Left Arm, Patient Position: Sitting, Cuff Size: Normal)   Pulse 65   Wt 191 lb 8 oz (86.9 kg)   SpO2 99%   BMI 25.79 kg/m    Subjective:    Patient ID: Geoffrey Ramirez, male    DOB: 09/20/1960, 57 y.o.   MRN: [id number]  HPI: KHARY SCHABEN is a 57 y.o. male  Chief Complaint  Patient presents with  . Migraine    3 to 4 this summer messes with vision for 20-30 minutes and pain can last 1-4 hours, patient just wants to make sure that they are migraines and that he does not need to have any type of scan done.    MIGRAINES Duration: Has had migraines since he was 57 years old, usually only 1 a year, but this summer has had 3-4 migraines. Had aura, some extra stress. Otherwise doing well.  Onset: sudden Severity: severe Quality: sharp Frequency: intermittent Location: behind his eyes Headache duration: hours Radiation: no Headache status at time of visit: current headache- not a migraine Treatments attempted: Treatments attempted: rest, APAP, ibuprofen, aleve", excedrine and triptans   Aura: yes Nausea:  yes Vomiting: no Photophobia:  yes Phonophobia:  no Effect on social functioning:  yes Numbers of missed days of school/work each month: 0 Confusion:  no Gait disturbance/ataxia:  no Behavioral changes:  no Fevers:  no   Relevant past medical, surgical, family and social history reviewed and updated as indicated. Interim medical history since our last visit reviewed. Allergies and medications reviewed and updated.  Review of Systems  Constitutional: Negative.   HENT: Negative.   Respiratory: Negative.   Cardiovascular: Negative.   Neurological: Positive for headaches. Negative for dizziness, tremors, seizures, syncope, facial asymmetry, speech difficulty, weakness, light-headedness and numbness.  Psychiatric/Behavioral: Negative.     Per HPI unless specifically indicated above     Objective:    BP 118/79 (BP Location: Left Arm, Patient  Position: Sitting, Cuff Size: Normal)   Pulse 65   Wt 191 lb 8 oz (86.9 kg)   SpO2 99%   BMI 25.79 kg/m   Wt Readings from Last 3 Encounters:  11/29/17 191 lb 8 oz (86.9 kg)  11/19/16 191 lb (86.6 kg)  05/11/16 188 lb (85.3 kg)    Physical Exam  Constitutional: He is oriented to person, place, and time. He appears well-developed and well-nourished. No distress.  HENT:  Head: Normocephalic and atraumatic.  Right Ear: Hearing normal.  Left Ear: Hearing normal.  Nose: Nose normal.  Eyes: Conjunctivae and lids are normal. Right eye exhibits no discharge. Left eye exhibits no discharge. No scleral icterus.  Cardiovascular: Normal rate, regular rhythm, normal heart sounds and intact distal pulses. Exam reveals no gallop and no friction rub.  No murmur heard. Pulmonary/Chest: Effort normal and breath sounds normal. No stridor. No respiratory distress. He has no wheezes. He has no rales. He exhibits no tenderness.  Musculoskeletal: Normal range of motion.  Neurological: He is alert and oriented to person, place, and time.  Skin: Skin is warm, dry and intact. Capillary refill takes less than 2 seconds. No rash noted. He is not diaphoretic. No erythema. No pallor.  Psychiatric: He has a normal mood and affect. His speech is normal and behavior is normal. Judgment and thought content normal. Cognition and memory are normal.  Nursing note and vitals reviewed.   Results for orders placed or performed in visit on 05/27/17  CBC and differential  Result Value  Ref Range   Hemoglobin 5.6 (A) 13.5 - 17.5   Neutrophils Absolute 6    WBC 8.7   VITAMIN D 25 Hydroxy (Vit-D Deficiency, Fractures)  Result Value Ref Range   Vit D, 25-Hydroxy 35.4   Lipid panel  Result Value Ref Range   Triglycerides 89 40 - 160   Cholesterol 196 0 - 200   HDL 54 35 - 70   LDL Cholesterol 124   TSH  Result Value Ref Range   TSH 1.86 0.41 - 5.90      Assessment & Plan:   Problem List Items Addressed This Visit       Cardiovascular and Mediastinum   Migraine headache with aura - Primary    Migraines have been getting significantly worse, increasing from at most 1 a year to 3-4 in the last couple of months. Given changes in his migraines, will get him MRI- if normal, will consider starting preventative medicine. Await results. Follow up with PCP as needed. Call with any concerns.       Relevant Medications   SUMAtriptan (IMITREX) 100 MG tablet    Other Visit Diagnoses    Acute nonintractable headache, unspecified headache type       Relevant Medications   SUMAtriptan (IMITREX) 100 MG tablet   Other Relevant Orders   MR Brain Wo Contrast       Follow up plan: Return As scheduled.

## 2017-12-10 ENCOUNTER — Encounter: Payer: BLUE CROSS/BLUE SHIELD | Admitting: Family Medicine

## 2017-12-19 ENCOUNTER — Other Ambulatory Visit: Payer: Self-pay | Admitting: Family Medicine

## 2017-12-19 ENCOUNTER — Encounter: Payer: Self-pay | Admitting: Family Medicine

## 2017-12-20 ENCOUNTER — Ambulatory Visit
Admission: RE | Admit: 2017-12-20 | Discharge: 2017-12-20 | Disposition: A | Discharge status: Home / Self Care | Payer: BLUE CROSS/BLUE SHIELD | Source: Ambulatory Visit | Attending: Family Medicine | Admitting: Family Medicine

## 2017-12-20 ENCOUNTER — Telehealth: Payer: Self-pay

## 2017-12-20 DIAGNOSIS — R519 Headache, unspecified: Secondary | ICD-10-CM

## 2017-12-20 DIAGNOSIS — I639 Cerebral infarction, unspecified: Secondary | ICD-10-CM | POA: Diagnosis not present

## 2017-12-20 DIAGNOSIS — G43109 Migraine with aura, not intractable, without status migrainosus: Secondary | ICD-10-CM

## 2017-12-20 DIAGNOSIS — I6389 Other cerebral infarction: Secondary | ICD-10-CM | POA: Diagnosis not present

## 2017-12-20 DIAGNOSIS — R51 Headache: Secondary | ICD-10-CM | POA: Insufficient documentation

## 2017-12-20 NOTE — Telephone Encounter (Signed)
Dr.Johnson received the call report from the MRI, called and discussed this with the patient.

## 2017-12-20 NOTE — Telephone Encounter (Signed)
Discussed report with patient which shows 4mm acute infarct in the R frontal lobe. Radiologist states that this happened within the last week. Patient denies any symptoms, has been feeling well. No headaches, no weakness. Will start baby aspirin and go to ER with neurologic changes. We will try to get him into neurology for evaluation ASAP.  Please let referral coordinators know urgent referral has been placed

## 2017-12-23 ENCOUNTER — Telehealth: Payer: Self-pay | Admitting: Family Medicine

## 2017-12-23 ENCOUNTER — Telehealth: Payer: Self-pay

## 2017-12-23 NOTE — Telephone Encounter (Signed)
That should be OK. Please let patient know appointment time and reiterate that if he has any change in his neurologic status he should go to the ER. Thanks.

## 2017-12-23 NOTE — Telephone Encounter (Signed)
Patient notified

## 2017-12-23 NOTE — Telephone Encounter (Signed)
Copied from [place] [id number]. Topic: Quick Communication - Other Results (Clinic Use ONLY) >> Dec 23, 2017  8:30 AM Lennox Solders wrote: Pt would like dr Jeananne Rama to return his call concerning MRI results >> Dec 23, 2017  8:52 AM Don Perking M wrote: MRI was ordered by Dr Wynetta Emery but pt is requesting a call back from Dr Jeananne Rama.

## 2017-12-23 NOTE — Telephone Encounter (Signed)
Results were discussed with patient with Dr. Wynetta Emery 12/20/2017 (see telephone note) Requesting call back from Dr. Jeananne Rama.

## 2017-12-23 NOTE — Telephone Encounter (Signed)
Copied from [place] [id number]. Topic: Referral - Question >> Dec 23, 2017 10:07 AM Robina Ade, Helene Kelp D wrote: Reason for CRM: I called around and the soonest they can get pt in to Neurology is at [facility] on 01/02/18 at 10:30. I booked the appointment but wanted to see what Dr. Wynetta Emery said. Please advise.

## 2017-12-25 ENCOUNTER — Observation Stay: Payer: BLUE CROSS/BLUE SHIELD

## 2017-12-25 ENCOUNTER — Observation Stay: Admit: 2017-12-25 | Payer: BLUE CROSS/BLUE SHIELD

## 2017-12-25 ENCOUNTER — Encounter: Payer: Self-pay | Admitting: Emergency Medicine

## 2017-12-25 ENCOUNTER — Other Ambulatory Visit: Payer: Self-pay

## 2017-12-25 ENCOUNTER — Observation Stay
Admission: EM | Admit: 2017-12-25 | Discharge: 2017-12-26 | Disposition: A | Discharge status: Home / Self Care | Payer: BLUE CROSS/BLUE SHIELD | Attending: Internal Medicine | Admitting: Internal Medicine

## 2017-12-25 ENCOUNTER — Observation Stay (HOSPITAL_BASED_OUTPATIENT_CLINIC_OR_DEPARTMENT_OTHER)
Admit: 2017-12-25 | Discharge: 2017-12-25 | Disposition: A | Discharge status: Home / Self Care | Payer: BLUE CROSS/BLUE SHIELD | Attending: Internal Medicine | Admitting: Internal Medicine

## 2017-12-25 DIAGNOSIS — E785 Hyperlipidemia, unspecified: Secondary | ICD-10-CM | POA: Diagnosis not present

## 2017-12-25 DIAGNOSIS — Z79899 Other long term (current) drug therapy: Secondary | ICD-10-CM | POA: Diagnosis not present

## 2017-12-25 DIAGNOSIS — G43109 Migraine with aura, not intractable, without status migrainosus: Secondary | ICD-10-CM | POA: Diagnosis not present

## 2017-12-25 DIAGNOSIS — G459 Transient cerebral ischemic attack, unspecified: Secondary | ICD-10-CM

## 2017-12-25 DIAGNOSIS — Z7902 Long term (current) use of antithrombotics/antiplatelets: Secondary | ICD-10-CM | POA: Insufficient documentation

## 2017-12-25 DIAGNOSIS — Z8249 Family history of ischemic heart disease and other diseases of the circulatory system: Secondary | ICD-10-CM | POA: Diagnosis not present

## 2017-12-25 DIAGNOSIS — I452 Bifascicular block: Secondary | ICD-10-CM | POA: Insufficient documentation

## 2017-12-25 DIAGNOSIS — I639 Cerebral infarction, unspecified: Secondary | ICD-10-CM | POA: Diagnosis not present

## 2017-12-25 DIAGNOSIS — N411 Chronic prostatitis: Secondary | ICD-10-CM | POA: Insufficient documentation

## 2017-12-25 DIAGNOSIS — I444 Left anterior fascicular block: Secondary | ICD-10-CM | POA: Diagnosis not present

## 2017-12-25 DIAGNOSIS — Z823 Family history of stroke: Secondary | ICD-10-CM | POA: Insufficient documentation

## 2017-12-25 DIAGNOSIS — Z7982 Long term (current) use of aspirin: Secondary | ICD-10-CM | POA: Diagnosis not present

## 2017-12-25 DIAGNOSIS — G43909 Migraine, unspecified, not intractable, without status migrainosus: Secondary | ICD-10-CM | POA: Diagnosis not present

## 2017-12-25 HISTORY — DX: Transient cerebral ischemic attack, unspecified: G45.9

## 2017-12-25 HISTORY — DX: Cerebral infarction, unspecified: I63.9

## 2017-12-25 LAB — URINALYSIS, COMPLETE (UACMP) WITH MICROSCOPIC
BACTERIA UA: NONE SEEN
Bilirubin Urine: NEGATIVE
Glucose, UA: 50 mg/dL — AB
HGB URINE DIPSTICK: NEGATIVE
Ketones, ur: NEGATIVE mg/dL
Leukocytes, UA: NEGATIVE
Nitrite: NEGATIVE
PROTEIN: NEGATIVE mg/dL
Specific Gravity, Urine: 1.005 (ref 1.005–1.030)
Squamous Epithelial / LPF: NONE SEEN (ref 0–5)
pH: 5 (ref 5.0–8.0)

## 2017-12-25 LAB — CBC WITH DIFFERENTIAL/PLATELET
ABS IMMATURE GRANULOCYTES: 0.03 10*3/uL (ref 0.00–0.07)
BASOS ABS: 0 10*3/uL (ref 0.0–0.1)
Basophils Relative: 0 %
Eosinophils Absolute: 0 10*3/uL (ref 0.0–0.5)
Eosinophils Relative: 0 %
HCT: 51.5 % (ref 39.0–52.0)
HEMOGLOBIN: 17.5 g/dL — AB (ref 13.0–17.0)
IMMATURE GRANULOCYTES: 0 %
LYMPHS PCT: 22 %
Lymphs Abs: 1.6 10*3/uL (ref 0.7–4.0)
MCH: 31.3 pg (ref 26.0–34.0)
MCHC: 34 g/dL (ref 30.0–36.0)
MCV: 92.1 fL (ref 80.0–100.0)
MONO ABS: 0.4 10*3/uL (ref 0.1–1.0)
Monocytes Relative: 6 %
NEUTROS ABS: 4.9 10*3/uL (ref 1.7–7.7)
Neutrophils Relative %: 72 %
Platelets: 180 10*3/uL (ref 150–400)
RBC: 5.59 MIL/uL (ref 4.22–5.81)
RDW: 13 % (ref 11.5–15.5)
WBC: 7 10*3/uL (ref 4.0–10.5)
nRBC: 0 % (ref 0.0–0.2)

## 2017-12-25 LAB — COMPREHENSIVE METABOLIC PANEL
ALK PHOS: 56 U/L (ref 38–126)
ALT: 20 U/L (ref 0–44)
AST: 22 U/L (ref 15–41)
Albumin: 4.2 g/dL (ref 3.5–5.0)
Anion gap: 9 (ref 5–15)
BUN: 17 mg/dL (ref 6–20)
CALCIUM: 9.2 mg/dL (ref 8.9–10.3)
CO2: 26 mmol/L (ref 22–32)
CREATININE: 0.9 mg/dL (ref 0.61–1.24)
Chloride: 104 mmol/L (ref 98–111)
GFR calc Af Amer: >60 mL/min (ref >60)
GFR calc non Af Amer: >60 mL/min (ref >60)
Glucose, Bld: 176 mg/dL — ABNORMAL HIGH (ref 70–99)
Potassium: 4.4 mmol/L (ref 3.5–5.1)
SODIUM: 139 mmol/L (ref 135–145)
Total Bilirubin: 0.9 mg/dL (ref 0.3–1.2)
Total Protein: 7.5 g/dL (ref 6.5–8.1)

## 2017-12-25 LAB — TROPONIN I: Troponin I: <0.03 ng/mL (ref <0.03)

## 2017-12-25 MED ORDER — ASPIRIN 325 MG PO TABS
325.0000 mg | ORAL_TABLET | Freq: Every day | ORAL | Status: DC
  Administered 2017-12-26: 09:00:00 325 mg via ORAL
  Filled 2017-12-25: qty 1

## 2017-12-25 MED ORDER — SENNOSIDES-DOCUSATE SODIUM 8.6-50 MG PO TABS: 1.0000 | ORAL_TABLET | Freq: Every evening | ORAL | Status: DC | PRN

## 2017-12-25 MED ORDER — ATORVASTATIN CALCIUM 20 MG PO TABS
40.0000 mg | ORAL_TABLET | Freq: Every day | ORAL | Status: DC
  Administered 2017-12-25: 40 mg via ORAL
  Filled 2017-12-25: qty 2

## 2017-12-25 MED ORDER — ACETAMINOPHEN 160 MG/5ML PO SOLN
650.0000 mg | ORAL | Status: DC | PRN
  Filled 2017-12-25: qty 20.3

## 2017-12-25 MED ORDER — STROKE: EARLY STAGES OF RECOVERY BOOK
Freq: Once | Status: AC
  Administered 2017-12-25: 16:00:00

## 2017-12-25 MED ORDER — ACETAMINOPHEN 325 MG PO TABS
650.0000 mg | ORAL_TABLET | ORAL | Status: DC | PRN
  Administered 2017-12-25: 650 mg via ORAL

## 2017-12-25 MED ORDER — IOPAMIDOL (ISOVUE-370) INJECTION 76%
75.0000 mL | Freq: Once | INTRAVENOUS | Status: AC | PRN
  Administered 2017-12-25: 75 mL via INTRAVENOUS

## 2017-12-25 MED ORDER — ADULT MULTIVITAMIN W/MINERALS CH
1.0000 | ORAL_TABLET | Freq: Every day | ORAL | Status: DC
  Administered 2017-12-26: 1 via ORAL
  Filled 2017-12-25: qty 1

## 2017-12-25 MED ORDER — RISAQUAD PO CAPS
1.0000 | ORAL_CAPSULE | Freq: Every day | ORAL | Status: DC
  Administered 2017-12-26: 09:00:00 1 via ORAL
  Filled 2017-12-25: qty 1

## 2017-12-25 MED ORDER — ACETAMINOPHEN 650 MG RE SUPP: 650.0000 mg | RECTAL | Status: DC | PRN

## 2017-12-25 MED ORDER — ASPIRIN 300 MG RE SUPP: 300.0000 mg | Freq: Every day | RECTAL | Status: DC

## 2017-12-25 MED ORDER — ACETAMINOPHEN 325 MG PO TABS
ORAL_TABLET | ORAL | Status: AC
  Filled 2017-12-25: qty 2

## 2017-12-25 MED ORDER — ASPIRIN 81 MG PO CHEW
324.0000 mg | CHEWABLE_TABLET | Freq: Once | ORAL | Status: AC
  Administered 2017-12-25: 324 mg via ORAL
  Filled 2017-12-25: qty 4

## 2017-12-25 MED ORDER — ENOXAPARIN SODIUM 40 MG/0.4ML ~~LOC~~ SOLN
40.0000 mg | SUBCUTANEOUS | Status: DC
  Administered 2017-12-25: 23:00:00 40 mg via SUBCUTANEOUS
  Filled 2017-12-25: qty 0.4

## 2017-12-25 MED ORDER — SUMATRIPTAN SUCCINATE 50 MG PO TABS
100.0000 mg | ORAL_TABLET | ORAL | Status: DC | PRN
  Filled 2017-12-25: qty 2

## 2017-12-25 MED ORDER — VITAMIN D 1000 UNITS PO TABS
2000.0000 [IU] | ORAL_TABLET | Freq: Every day | ORAL | Status: DC
  Administered 2017-12-26: 2000 [IU] via ORAL
  Filled 2017-12-25: qty 2

## 2017-12-25 MED ORDER — FINASTERIDE 5 MG PO TABS
5.0000 mg | ORAL_TABLET | Freq: Every day | ORAL | Status: DC
  Administered 2017-12-26: 5 mg via ORAL
  Filled 2017-12-25: qty 1

## 2017-12-25 NOTE — Consult Note (Signed)
Referring Physician: Demetrios Loll, MD    Chief Complaint: Left facial numbness, headache  HPI: Geoffrey Ramirez is an 57 y.o. male with history of chronic migraine with aura, chronic prostatitis, intermittent episodes of angina presenting to the ED with right frontal headaches and left-sided facial paresthesias. Patient state that he was evaluated by his PCP  for migraine headaches and had an MRI brain done on 12/20/2017 which showed an abnormality in the right frontal cortex concerning for an infarct. He was advised that if he developed any neurological symptoms to present to the ED for further evaluation. Patient state that symptoms started when he woke up this morning at around 6:15 am to get ready for work. He had an aura which he described as flickering light at the corner of his left eye prior to onset of headache. Per significant other who is accompanying him today, he also complained of left facial tingling sensation which is now resolved and left ear fullness. He denies associated symptoms of dizziness, extremity weakness, speech or gait disturbances. No prior history of strokes. Initial NIH stroke scale 1  Date last known well: Date: 12/24/2017 Time last known well: Time: 22:00 tPA Given: No: outside time window, resolution of symptoms  Past Medical History:  Diagnosis Date  . Chronic prostatitis     Past Surgical History:  Procedure Laterality Date  . ANTERIOR CRUCIATE LIGAMENT REPAIR    . CHOLECYSTECTOMY      Family History  Problem Relation Age of Onset  . Cancer Mother   . Mental illness Mother   . Diabetes Father   . Cancer Maternal Grandmother   . Diabetes Maternal Grandfather   . Heart disease Maternal Grandfather   . Stroke Maternal Grandfather   . Glaucoma Maternal Grandfather    Social History:  reports that he has never smoked. He has never used smokeless tobacco. He reports that he drinks alcohol. He reports that he does not use drugs.  Allergies: No Known  Allergies  Medications: I have reviewed the patient's current medications.   Prior to Admission medications   Medication Sig Start Date End Date Taking? Authorizing Provider  aspirin EC 81 MG tablet Take 81 mg by mouth daily.   Yes [provider]  Cholecalciferol (VITAMIN D) 2000 units CAPS Take 2,000 Units by mouth daily.    Yes [provider]  finasteride (PROSCAR) 5 MG tablet TAKE 1 TABLET BY MOUTH EVERY DAY Patient taking differently: Take 5 mg by mouth daily.  12/19/17  Yes Crissman, Jeannette How, MD  ibuprofen (ADVIL,MOTRIN) 600 MG tablet Take 1 tablet (600 mg total) by mouth every 8 (eight) hours as needed. Patient taking differently: Take 600 mg by mouth every 8 (eight) hours as needed for headache or moderate pain.  11/29/15  Yes Edrick Kins, DPM  multivitamin (ONE-A-DAY MEN'S) TABS tablet Take 1 tablet by mouth daily.   Yes [provider]  omega-3 acid ethyl esters (LOVAZA) 1 g capsule Take 2 g by mouth daily.   Yes [provider]  Probiotic Product (ALIGN) 4 MG CAPS Take 4 mg by mouth daily.   Yes [provider]  SUMAtriptan (IMITREX) 100 MG tablet Take 1 tablet (100 mg total) by mouth every 2 (two) hours as needed for migraine. Max 200mg  in 1 day 11/29/17  Yes Johnson, Megan P, DO  vardenafil (LEVITRA) 20 MG tablet Take 1 tablet (20 mg total) by mouth daily as needed for erectile dysfunction. 11/19/16  Yes Crissman, Jeannette How,  MD     ROS: History obtained from the patient   General ROS: negative for - chills, fatigue, fever, night sweats, weight gain or weight loss Psychological ROS: negative for - behavioral disorder, hallucinations, memory difficulties, mood swings or suicidal ideation Ophthalmic ROS: negative for - blurry vision, double vision, eye pain or loss of vision ENT ROS: negative for - epistaxis, nasal discharge, oral lesions, sore throat, tinnitus or vertigo Allergy and Immunology ROS: negative for - hives or itchy/watery  eyes Hematological and Lymphatic ROS: negative for - bleeding problems, bruising or swollen lymph nodes Endocrine ROS: negative for - galactorrhea, hair pattern changes, polydipsia/polyuria or temperature intolerance Respiratory ROS: negative for - cough, hemoptysis, shortness of breath or wheezing Cardiovascular ROS: negative for - chest pain, dyspnea on exertion, edema or irregular heartbeat Gastrointestinal ROS: negative for - abdominal pain, diarrhea, hematemesis, nausea/vomiting or stool incontinence Genito-Urinary ROS: negative for - dysuria, hematuria, incontinence or urinary frequency/urgency Musculoskeletal ROS: negative for - joint swelling or muscular weakness Neurological ROS: as noted in HPI Dermatological ROS: negative for rash and skin lesion changes  Physical Examination: Blood pressure 133/78, pulse 71, temperature 98.2 F (36.8 C), temperature source Oral, resp. rate 20, height 6' (1.829 m), weight 84.8 kg, SpO2 97 %.   HEENT-  Normocephalic, no lesions, without obvious abnormality.  Normal external eye and conjunctiva.  Normal TM's bilaterally.  Normal auditory canals and external ears. Normal external nose, mucus membranes and septum.  Normal pharynx. Cardiovascular- S1, S2 normal, pulses palpable throughout   Lungs- chest clear, no wheezing, rales, normal symmetric air entry Abdomen- soft, non-tender; bowel sounds normal; no masses,  no organomegaly Extremities- no edema Lymph-no adenopathy palpable Musculoskeletal-no joint tenderness, deformity or swelling Skin-warm and dry, no hyperpigmentation, vitiligo, or suspicious lesions  Neurological Exam   Mental Status: Alert, oriented, thought content appropriate.  Speech fluent without evidence of aphasia.  Able to follow 3 step commands without difficulty. Attention span and concentration seemed appropriate  Cranial Nerves: II: Discs flat bilaterally; Visual fields grossly normal, pupils equal, round, reactive to light  and accommodation III,IV, VI: ptosis not present, extra-ocular motions intact bilaterally V,VII: smile symmetric, facial light touch sensation intact VIII: hearing normal bilaterally IX,X: gag reflex present XI: bilateral shoulder shrug XII: midline tongue extension Motor: Right :  Upper extremity   5/5 Without pronator drift      Left: Upper extremity   5/5 without pronator drift Right:   Lower extremity   5/5                                          Left: Lower extremity   5/5 Tone and bulk:normal tone throughout; no atrophy noted Sensory: Pinprick and light touch intact bilaterally Deep Tendon Reflexes: 2+ and symmetric throughout Plantars: Right: downgoing                           Left: downgoing Cerebellar: Finger-to-nose testing intact bilaterally. Heel to shin testing normal bilaterally Gait: normal gait and station  Data Reviewed  Laboratory Studies:  Basic Metabolic Panel: Recent Labs  Lab 12/25/17 0822  NA 139  K 4.4  CL 104  CO2 26  GLUCOSE 176*  BUN 17  CREATININE 0.90  CALCIUM 9.2    Liver Function Tests: Recent Labs  Lab 12/25/17 0822  AST 22  ALT 20  ALKPHOS 56  BILITOT 0.9  PROT 7.5  ALBUMIN 4.2   No results for input(s): LIPASE, AMYLASE in the last 168 hours. No results for input(s): AMMONIA in the last 168 hours.  CBC: Recent Labs  Lab 12/25/17 0822  WBC 7.0  NEUTROABS 4.9  HGB 17.5*  HCT 51.5  MCV 92.1  PLT 180    Cardiac Enzymes: Recent Labs  Lab 12/25/17 0822  TROPONINI <0.03    BNP: Invalid input(s): POCBNP  CBG: No results for input(s): GLUCAP in the last 168 hours.  Microbiology: No results found for this or any previous visit.  Coagulation Studies: No results for input(s): LABPROT, INR in the last 72 hours.  Urinalysis:  Recent Labs  Lab 12/25/17 0822  COLORURINE STRAW*  LABSPEC 1.005  PHURINE 5.0  GLUCOSEU 50*  HGBUR NEGATIVE  BILIRUBINUR NEGATIVE  KETONESUR NEGATIVE  PROTEINUR NEGATIVE  NITRITE  NEGATIVE  LEUKOCYTESUR NEGATIVE    Lipid Panel:    Component Value Date/Time   CHOL 196 05/21/2017   CHOL 188 07/04/2015 1024   TRIG 89 05/21/2017   HDL 54 05/21/2017   HDL 54 07/04/2015 1024   CHOLHDL 3.5 07/04/2015 1024   LDLCALC 124 05/21/2017   LDLCALC 112 (H) 07/04/2015 1024    HgbA1C: No results found for: HGBA1C  Urine Drug Screen:  No results found for: LABOPIA, COCAINSCRNUR, LABBENZ, AMPHETMU, THCU, LABBARB  Alcohol Level: No results for input(s): ETH in the last 168 hours.  Other results: EKG: normal EKG, normal sinus rhythm, unchanged from previous tracings.  Imaging: MRI HEAD WITHOUT CONTRAST (12/20/2017)  TECHNIQUE: Multiplanar, multiecho pulse sequences of the brain and surrounding structures were obtained without intravenous contrast.  COMPARISON:  None.  FINDINGS: Brain: 3 mm focus of restricted diffusion, RIGHT frontal cortex, corresponding low ADC, consistent with acute infarction. No visible hemorrhage, hydrocephalus, extra-axial collection or mass lesion. Normal for age cerebral volume.  Scattered T2 and FLAIR hyperintensities in the periventricular and subcortical white matter, nonspecific, but statistically most commonly associated with chronic microvascular ischemic change. Other considerations would include complicated migraine, vasculitis, chronic infection, or demyelinating disease. Mild prominence perivascular spaces.  Vascular: Normal flow voids.  Skull and upper cervical spine: Normal marrow signal.  Sinuses/Orbits: Negative.  Other: None.  IMPRESSION: 3 mm focus of acute infarction, RIGHT frontal cortex. No hemorrhage or mass effect. Significance uncertain.  Mild T2 and FLAIR hyperintensities in the periventricular and subcortical white matter, favored to represent chronic microvascular ischemic change.  No intracranial or extracranial cause for headaches is observed.  These results will be called to the  ordering clinician or representative by the Radiologist Assistant, and communication documented in the PACS or zVision Dashboard.   Electronically Signed   By: Staci Righter M.D.   On: 12/20/2017 15:37   Patient seen and examined.  Clinical course and management discussed.  Necessary edits performed.  I agree with the above.  Assessment and plan of care developed and discussed below.     Assessment: 57 y.o. male with history of chronic migraine with aura, chronic prostatitis, intermittent episodes of angina presenting with right frontal headaches, left-sided facial paresthesias and left ear fullness.  Symptoms now resolved.  Has had a recent MRI of the brain for headaches on 12/20/17 which has been reviewed.  It shows a 3 mm right frontal cortex acute infarct.  Although these small lesions can be seen with migraine can not rule out an embolic etiology for stroke.  Patient on no antiplatelet or anticoagulation therapy prior  to this episode.  Stroke Risk Factors - family history  Plan: 1. HgbA1c, fasting lipid panel 2. CTA head and neck with and without contrast 3. PT consult, OT consult, Speech consult 4. Echocardiogram with bubble study 5. Prophylactic therapy- Aspirin 81 mg/day 6. NPO until RN stroke swallow screen 7. Telemetry monitoring 8. Frequent neuro checks  This patient was staffed with Dr. Magda Paganini, Doy Mince who personally evaluated patient, reviewed documentation and agreed with assessment and plan of care as above.  Rufina Falco, DNP, FNP-BC Board certified Nurse Practitioner Neurology Department  12/25/2017, 12:33 PM  Alexis Goodell, MD Neurology [phone]  12/25/2017  3:34 PM

## 2017-12-25 NOTE — ED Notes (Signed)
Pt and SO given meal tray. Explained and apologized for delay. Will continue to monitor for further patient needs.

## 2017-12-25 NOTE — ED Notes (Signed)
This RN to bedside, introduced self to patient and SO at bedside. Pt c/o mild HA at this time. Pt up to the bathroom independently. Will continue to monitor for further patient needs.

## 2017-12-25 NOTE — ED Notes (Signed)
Pt states he has been having issues with increased frequency of migraine this summer and had an MRI on Friday that had some abnormality and was told to come to the ED if he noticed any changes. States today he woke up with some numbness/fullness to the left side of the face and had a headache on the right side. States the numbness increased and was concerned so he came to the ED. Denies any issues last night prior to going to bed. Face is symmetrical. No noted deficits. Grips equal. Denies any issues with speech or swallowing. Pt is a/ox4 on arrival, wife is at the bedside.

## 2017-12-25 NOTE — H&P (Signed)
[facility] [place] NAME: Geoffrey Ramirez    MR#:  [id number]  DATE OF BIRTH:  Aug 31, 1960  DATE OF ADMISSION:  12/25/2017  PRIMARY CARE PHYSICIAN: Guadalupe Maple, MD   REQUESTING/REFERRING PHYSICIAN: Dr. Jimmye Norman.  CHIEF COMPLAINT:   Chief Complaint  Patient presents with  . Headache   Headaches in the left facial numbness today. HISTORY OF PRESENT ILLNESS:  Geoffrey Ramirez  is a 57 y.o. male with a known history of recent acute CVA, hyperlipidemia, migraine headache and chronic prostatitis.  The patient was diagnosed with acute CVA 5 days ago, MRI of the brain showed 3 mm focus of acute infarction, RIGHT frontal cortex.  He has been taking aspirin 81 mg p.o. daily for the past 5 days.  He had headache in the left side facial numbness this morning, which resolved later.  She denies any dizziness, incontinence, dysphagia or slurred speech.  No focal weakness or numbness.  Dr. Jimmye Norman discussed with Dr. Kathlen Brunswick, who suggested admission for further work-up.  PAST MEDICAL HISTORY:   Past Medical History:  Diagnosis Date  . Chronic prostatitis     PAST SURGICAL HISTORY:   Past Surgical History:  Procedure Laterality Date  . ANTERIOR CRUCIATE LIGAMENT REPAIR    . CHOLECYSTECTOMY      SOCIAL HISTORY:   Social History   Tobacco Use  . Smoking status: Never Smoker  . Smokeless tobacco: Never Used  Substance Use Topics  . Alcohol use: Yes    Comment: rare    FAMILY HISTORY:   Family History  Problem Relation Age of Onset  . Cancer Mother   . Mental illness Mother   . Diabetes Father   . Cancer Maternal Grandmother   . Diabetes Maternal Grandfather   . Heart disease Maternal Grandfather   . Stroke Maternal Grandfather   . Glaucoma Maternal Grandfather     DRUG ALLERGIES:  No Known Allergies  REVIEW OF SYSTEMS:   Review of Systems  Constitutional: Negative for chills, fever and malaise/fatigue.  HENT: Negative for  sore throat.   Eyes: Negative for blurred vision and double vision.  Respiratory: Negative for cough, hemoptysis, shortness of breath, wheezing and stridor.   Cardiovascular: Negative for chest pain, palpitations, orthopnea and leg swelling.  Gastrointestinal: Negative for abdominal pain, blood in stool, diarrhea, melena, nausea and vomiting.  Genitourinary: Negative for dysuria, flank pain and hematuria.  Musculoskeletal: Negative for back pain and joint pain.  Skin: Negative for rash.  Neurological: Positive for sensory change and headaches. Negative for dizziness, tingling, tremors, speech change, focal weakness, seizures, loss of consciousness and weakness.  Endo/Heme/Allergies: Negative for polydipsia.  Psychiatric/Behavioral: Negative for depression. The patient is not nervous/anxious.     MEDICATIONS AT HOME:   Prior to Admission medications   Medication Sig Start Date End Date Taking? Authorizing Provider  aspirin EC 81 MG tablet Take 81 mg by mouth daily.   Yes [provider]  Cholecalciferol (VITAMIN D) 2000 units CAPS Take 2,000 Units by mouth daily.    Yes [provider]  finasteride (PROSCAR) 5 MG tablet TAKE 1 TABLET BY MOUTH EVERY DAY Patient taking differently: Take 5 mg by mouth daily.  12/19/17  Yes Crissman, Jeannette How, MD  ibuprofen (ADVIL,MOTRIN) 600 MG tablet Take 1 tablet (600 mg total) by mouth every 8 (eight) hours as needed. Patient taking differently: Take 600 mg by mouth every 8 (eight) hours as needed for headache or moderate pain.  11/29/15  Yes Edrick Kins, DPM  multivitamin (ONE-A-DAY MEN'S) TABS tablet Take 1 tablet by mouth daily.   Yes [provider]  omega-3 acid ethyl esters (LOVAZA) 1 g capsule Take 2 g by mouth daily.   Yes [provider]  Probiotic Product (ALIGN) 4 MG CAPS Take 4 mg by mouth daily.   Yes [provider]  SUMAtriptan (IMITREX) 100 MG tablet Take 1 tablet (100 mg total) by mouth every 2  (two) hours as needed for migraine. Max 200mg  in 1 day 11/29/17  Yes Johnson, Megan P, DO  vardenafil (LEVITRA) 20 MG tablet Take 1 tablet (20 mg total) by mouth daily as needed for erectile dysfunction. 11/19/16  Yes Crissman, Jeannette How, MD      VITAL SIGNS:  Blood pressure (!) 147/81, pulse 70, temperature 98.2 F (36.8 C), temperature source Oral, resp. rate 19, height 6' (1.829 m), weight 84.8 kg, SpO2 98 %.  PHYSICAL EXAMINATION:  Physical Exam  GENERAL:  57 y.o.-year-old patient lying in the bed with no acute distress.  EYES: Pupils equal, round, reactive to light and accommodation. No scleral icterus. Extraocular muscles intact.  HEENT: Head atraumatic, normocephalic. Oropharynx and nasopharynx clear.  NECK:  Supple, no jugular venous distention. No thyroid enlargement, no tenderness.  LUNGS: Normal breath sounds bilaterally, no wheezing, rales,rhonchi or crepitation. No use of accessory muscles of respiration.  CARDIOVASCULAR: S1, S2 normal. No murmurs, rubs, or gallops.  ABDOMEN: Soft, nontender, nondistended. Bowel sounds present. No organomegaly or mass.  EXTREMITIES: No pedal edema, cyanosis, or clubbing.  NEUROLOGIC: Cranial nerves II through XII are intact. Muscle strength 5/5 in all extremities. Sensation intact. Gait not checked.  PSYCHIATRIC: The patient is alert and oriented x 3.  SKIN: No obvious rash, lesion, or ulcer.   LABORATORY PANEL:   CBC Recent Labs  Lab 12/25/17 0822  WBC 7.0  HGB 17.5*  HCT 51.5  PLT 180   ------------------------------------------------------------------------------------------------------------------  Chemistries  Recent Labs  Lab 12/25/17 0822  NA 139  K 4.4  CL 104  CO2 26  GLUCOSE 176*  BUN 17  CREATININE 0.90  CALCIUM 9.2  AST 22  ALT 20  ALKPHOS 56  BILITOT 0.9   ------------------------------------------------------------------------------------------------------------------  Cardiac Enzymes Recent Labs  Lab  12/25/17 0822  TROPONINI <0.03   ------------------------------------------------------------------------------------------------------------------  RADIOLOGY:  No results found.    IMPRESSION AND PLAN:   TIA with recent history of acute CVA. The patient will be placed for observation. Increase aspirin to 325 mg p.o. daily, start Lipitor, follow-up lipid panel and hemoglobin A1c, neurochecks, CT angiogram of the head and neck, echocardiograph and neurology consult.  Hyperlipidemia.  Start Lipitor, follow-up lipid panel.  Migraine headache.  Continue home medication.  Chronic proctitis.  Continue Proscar.   All the records are reviewed and case discussed with ED provider. Management plans discussed with the patient, family and they are in agreement.  CODE STATUS: Full code  TOTAL TIME TAKING CARE OF THIS PATIENT:  35 minutes.    Demetrios Loll M.D on 12/25/2017 at 10:07 AM  Between 7am to 6pm - Pager - [phone]  After 6pm go to [email or web address] - Proofreader  [facility]  Office  [phone]  CC: Primary care physician; Guadalupe Maple, MD   Note: This dictation was prepared with Dragon dictation along with smaller phrase technology. Any transcriptional errors that result from this process are unin

## 2017-12-25 NOTE — ED Notes (Signed)
Patient ambulatory to Rm 6, Stephaine RN and Dr. Jimmye Norman aware of room placement.

## 2017-12-25 NOTE — ED Triage Notes (Signed)
Patient here complaining of right frontal HA, starting this AM, hx of migraines.  States he saw sparkles this AM.  Had MRI on Friday with a "small abnormality", patient was instructed by his MD to get any new changes investigated.  States the left side of his face feels like he has "just come out of the dentist's office".  States his left ear also feels full.  Denies any other symptoms. Has neurology appt next Thursday with Dr. Melrose Nakayama in [place] he believes.  Facial features symmetrical.  Grip strength equal.  No deficits noted.

## 2017-12-25 NOTE — ED Provider Notes (Signed)
[facility] Emergency Department Provider Note       Time seen: ----------------------------------------- 8:01 AM on 12/25/2017 -----------------------------------------   I have reviewed the triage vital signs and the nursing notes.  HISTORY   Chief Complaint Headache    HPI Geoffrey Ramirez is a 57 y.o. male with a history of migraine headaches and chronic prostatitis who presents to the ED for right frontal headache that started this morning with a history of migraines.  Patient states he sees "sparkles" in his vision.  He had an MRI on Friday that showed a small abnormality and he was instructed by his doctor to come to the hospital if any new or logic symptoms presented.  She states this morning he had some left-sided facial paresthesias.  He has an appointment with the neurologist on Thursday.  He denies any other neurologic symptoms or other complaints.  Past Medical History:  Diagnosis Date  . Chronic prostatitis     Patient Active Problem List   Diagnosis Date Noted  . Plantar fasciitis of right foot 11/19/2016  . Migraine headache with aura 11/19/2016  . Chronic prostatitis 11/01/2014    Past Surgical History:  Procedure Laterality Date  . ANTERIOR CRUCIATE LIGAMENT REPAIR    . CHOLECYSTECTOMY      Allergies Patient has no known allergies.  Social History Social History   Tobacco Use  . Smoking status: Never Smoker  . Smokeless tobacco: Never Used  Substance Use Topics  . Alcohol use: Yes    Comment: rare  . Drug use: No   Review of Systems Constitutional: Negative for fever. Cardiovascular: Negative for chest pain. Respiratory: Negative for shortness of breath. Gastrointestinal: Negative for abdominal pain, vomiting and diarrhea. Musculoskeletal: Negative for back pain. Skin: Negative for rash. Neurological: Positive for headache, facial paresthesia  All systems negative/normal/unremarkable except as stated in the  HPI  ____________________________________________   PHYSICAL EXAM:  VITAL SIGNS: ED Triage Vitals  Enc Vitals Group     BP 12/25/17 0750 (!) 144/78     Pulse Rate 12/25/17 0750 66     Resp 12/25/17 0750 16     Temp 12/25/17 0750 98.2 F (36.8 C)     Temp Source 12/25/17 0750 Oral     SpO2 12/25/17 0750 99 %     Weight 12/25/17 0747 187 lb (84.8 kg)     Height 12/25/17 0747 6' (1.829 m)     Head Circumference --      Peak Flow --      Pain Score 12/25/17 0746 3     Pain Loc --      Pain Edu? --      Excl. in [place]? --    Constitutional: Alert and oriented. Well appearing and in no distress. Eyes: Conjunctivae are normal. Normal extraocular movements.  Pupils equal round and reactive to light ENT   Head: Normocephalic and atraumatic.   Nose: No congestion/rhinnorhea.   Mouth/Throat: Mucous membranes are moist.   Neck: No stridor. Cardiovascular: Normal rate, regular rhythm. No murmurs, rubs, or gallops. Respiratory: Normal respiratory effort without tachypnea nor retractions. Breath sounds are clear and equal bilaterally. No wheezes/rales/rhonchi. Gastrointestinal: Soft and nontender. Normal bowel sounds Musculoskeletal: Nontender with normal range of motion in extremities. No lower extremity tenderness nor edema. Neurologic:  Normal speech and language. Strength, cranial nerves, finger-to-nose testing are normal.  Left mid face paresthesia compared to right Skin:  Skin is warm, dry and intact. No rash noted. Psychiatric: Mood and affect  are normal. Speech and behavior are normal.  ____________________________________________  EKG: Interpreted by me.  His rhythm rate 75 bpm, right bundle branch block, left anterior fascicular block  ____________________________________________  ED COURSE:  As part of my medical decision making, I reviewed the following data within the [place] History obtained from family if available, nursing notes, old chart  and ekg, as well as notes from prior ED visits. Patient presented for headache with neurologic symptoms, we will assess with labs and imaging as indicated at this time.  Recent MRI reveals acute right frontal lobe infarct   Procedures ____________________________________________   LABS (pertinent positives/negatives)  Labs Reviewed  CBC WITH DIFFERENTIAL/PLATELET - Abnormal; Notable for the following components:      Result Value   Hemoglobin 17.5 (*)    All other components within normal limits  COMPREHENSIVE METABOLIC PANEL - Abnormal; Notable for the following components:   Glucose, Bld 176 (*)    All other components within normal limits  URINALYSIS, COMPLETE (UACMP) WITH MICROSCOPIC - Abnormal; Notable for the following components:   Color, Urine STRAW (*)    APPearance CLEAR (*)    Glucose, UA 50 (*)    All other components within normal limits  TROPONIN I   ____________________________________________  DIFFERENTIAL DIAGNOSIS   Complicated migraine, CVA, TIA  FINAL ASSESSMENT AND PLAN  CVA, migraine headache   Plan: The patient had presented for migraine headache with left facial paresthesias which have resolved now. Patient's labs are unremarkable. Patient's imaging from 5 days ago revealed acute right frontal lobe infarct.  I did give him a full aspirin and have discussed with neurology.  He will need to be admitted for a full stroke work-up.   Laurence Aly, MD   Note: This note was generated in part or whole with voice recognition software. Voice recognition is usually quite accurate but there are transcription errors that can and very often do occur. I apologize for any typographical errors that were not detected and corrected.     Earleen Newport, MD 12/25/17 [phone]

## 2017-12-25 NOTE — ED Notes (Signed)
Pt continues to rest in bed, SO remains at bedside at this time. This RN explained and apologized for delay. Pt states understanding. Pt given remote to TV at this time. Denies further needs. Will continue to monitor for further patient needs.

## 2017-12-25 NOTE — ED Notes (Signed)
Pt transported to room 123 

## 2017-12-26 ENCOUNTER — Observation Stay (HOSPITAL_BASED_OUTPATIENT_CLINIC_OR_DEPARTMENT_OTHER)
Admit: 2017-12-26 | Discharge: 2017-12-26 | Disposition: A | Discharge status: Home / Self Care | Payer: BLUE CROSS/BLUE SHIELD | Attending: Nurse Practitioner | Admitting: Nurse Practitioner

## 2017-12-26 DIAGNOSIS — N411 Chronic prostatitis: Secondary | ICD-10-CM | POA: Diagnosis not present

## 2017-12-26 DIAGNOSIS — Z79899 Other long term (current) drug therapy: Secondary | ICD-10-CM | POA: Diagnosis not present

## 2017-12-26 DIAGNOSIS — Z7902 Long term (current) use of antithrombotics/antiplatelets: Secondary | ICD-10-CM | POA: Diagnosis not present

## 2017-12-26 DIAGNOSIS — Z823 Family history of stroke: Secondary | ICD-10-CM | POA: Diagnosis not present

## 2017-12-26 DIAGNOSIS — Z7982 Long term (current) use of aspirin: Secondary | ICD-10-CM | POA: Diagnosis not present

## 2017-12-26 DIAGNOSIS — E785 Hyperlipidemia, unspecified: Secondary | ICD-10-CM | POA: Diagnosis not present

## 2017-12-26 DIAGNOSIS — Z8249 Family history of ischemic heart disease and other diseases of the circulatory system: Secondary | ICD-10-CM | POA: Diagnosis not present

## 2017-12-26 DIAGNOSIS — I639 Cerebral infarction, unspecified: Secondary | ICD-10-CM | POA: Diagnosis not present

## 2017-12-26 DIAGNOSIS — I452 Bifascicular block: Secondary | ICD-10-CM | POA: Diagnosis not present

## 2017-12-26 DIAGNOSIS — G43109 Migraine with aura, not intractable, without status migrainosus: Secondary | ICD-10-CM | POA: Diagnosis not present

## 2017-12-26 DIAGNOSIS — G43909 Migraine, unspecified, not intractable, without status migrainosus: Secondary | ICD-10-CM | POA: Diagnosis not present

## 2017-12-26 LAB — HEMOGLOBIN A1C
HEMOGLOBIN A1C: 5.3 % (ref 4.8–5.6)
MEAN PLASMA GLUCOSE: 105.41 mg/dL

## 2017-12-26 LAB — LIPID PANEL
Cholesterol: 168 mg/dL (ref 0–200)
HDL: 40 mg/dL — ABNORMAL LOW (ref >40)
LDL CALC: 105 mg/dL — AB (ref 0–99)
Total CHOL/HDL Ratio: 4.2 RATIO
Triglycerides: 116 mg/dL (ref <150)
VLDL: 23 mg/dL (ref 0–40)

## 2017-12-26 LAB — ECHOCARDIOGRAM COMPLETE
HEIGHTINCHES: 72 in
Weight: 2996.8 oz

## 2017-12-26 MED ORDER — CLOPIDOGREL BISULFATE 75 MG PO TABS: 75.0000 mg | ORAL_TABLET | Freq: Every day | ORAL | 0 refills | Status: DC

## 2017-12-26 MED ORDER — LORATADINE 10 MG PO TABS: 10.0000 mg | ORAL_TABLET | Freq: Every day | ORAL | 0 refills | Status: DC

## 2017-12-26 MED ORDER — ATORVASTATIN CALCIUM 40 MG PO TABS: 40.0000 mg | ORAL_TABLET | Freq: Every day | ORAL | 0 refills | Status: DC

## 2017-12-26 NOTE — Discharge Summary (Signed)
[place] [facility] [place] NAME: Geoffrey Ramirez    MR#:  [id number]  DATE OF BIRTH:  Mar 21, 1960  DATE OF ADMISSION:  12/25/2017 ADMITTING PHYSICIAN: Demetrios Loll, MD  DATE OF DISCHARGE: 12/26/2017  2:36 PM  PRIMARY CARE PHYSICIAN: Guadalupe Maple, MD    ADMISSION DIAGNOSIS:  Cerebrovascular accident (CVA), unspecified mechanism ([place]) [I63.9]  DISCHARGE DIAGNOSIS:  Acute CVA  SECONDARY DIAGNOSIS:   Past Medical History:  Diagnosis Date  . Chronic prostatitis     HOSPITAL COURSE:   1.  Acute CVA with 3 mm focus right frontal cortex seen on prior MRI.  CT angiogram of the neck brain showed no blockages.  Echocardiogram with bubble study showed that the atrium septum was agitated with saline which suggest a right to left atrial shunt.  Imaging suboptimal for patent foramen ovale or atrial septal defect.  Case discussed with neurology and they recommended aspirin and Plavix and follow-up as outpatient.  Also prescribed Lipitor.  Follow-up with neurology and cardiology as outpatient.  Consideration for TEE as outpatient. 2.  Hyperlipidemia unspecified.  LDL 105.  Goal more aggressively less than 70. 3.  History of migraine.  Follow-up with neurology as outpatient  DISCHARGE CONDITIONS:   Satisfactory  CONSULTS OBTAINED:  Treatment Team:  Catarina Hartshorn, MD  DRUG ALLERGIES:  No Known Allergies  DISCHARGE MEDICATIONS:   Allergies as of 12/26/2017   No Known Allergies     Medication List    STOP taking these medications   ibuprofen 600 MG tablet Commonly known as:  ADVIL,MOTRIN   vardenafil 20 MG tablet Commonly known as:  LEVITRA     TAKE these medications   ALIGN 4 MG Caps Take 4 mg by mouth daily.   aspirin EC 81 MG tablet Take 81 mg by mouth daily.   atorvastatin 40 MG tablet Commonly known as:  LIPITOR Take 1 tablet (40 mg total) by mouth daily at 6 PM.   clopidogrel 75 MG tablet Commonly known as:  PLAVIX Take 1  tablet (75 mg total) by mouth daily.   finasteride 5 MG tablet Commonly known as:  PROSCAR TAKE 1 TABLET BY MOUTH EVERY DAY   loratadine 10 MG tablet Commonly known as:  CLARITIN Take 1 tablet (10 mg total) by mouth daily.   multivitamin Tabs tablet Take 1 tablet by mouth daily.   omega-3 acid ethyl esters 1 g capsule Commonly known as:  LOVAZA Take 2 g by mouth daily.   SUMAtriptan 100 MG tablet Commonly known as:  IMITREX Take 1 tablet (100 mg total) by mouth every 2 (two) hours as needed for migraine. Max 200mg  in 1 day   Vitamin D 2000 units Caps Take 2,000 Units by mouth daily.        DISCHARGE INSTRUCTIONS:   Follow-up PMD 5 days Follow-up neurology 1 week Follow-up cardiology 2 weeks  If you experience worsening of your admission symptoms, develop shortness of breath, life threatening emergency, suicidal or homicidal thoughts you must seek medical attention immediately by calling 911 or calling your MD immediately  if symptoms less severe.  You Must read complete instructions/literature along with all the possible adverse reactions/side effects for all the Medicines you take and that have been prescribed to you. Take any new Medicines after you have completely understood and accept all the possible adverse reactions/side effects.   Please note  You were cared for by a hospitalist during your hospital stay. If you have any questions  about your discharge medications or the care you received while you were in the hospital after you are discharged, you can call the unit and asked to speak with the hospitalist on call if the hospitalist that took care of you is not available. Once you are discharged, your primary care physician will handle any further medical issues. Please note that NO REFILLS for any discharge medications will be authorized once you are discharged, as it is imperative that you return to your primary care physician (or establish a relationship with a  primary care physician if you do not have one) for your aftercare needs so that they can reassess your need for medications and monitor your lab values.    Today   CHIEF COMPLAINT:   Chief Complaint  Patient presents with  . Headache    HISTORY OF PRESENT ILLNESS:  Geoffrey Ramirez  is a 57 y.o. male presented with headache and facial tingling and found to have a stroke   VITAL SIGNS:  Blood pressure 131/73, pulse 63, temperature 97.8 F (36.6 C), temperature source Oral, resp. rate 18, height 6' (1.829 m), weight 85 kg, SpO2 99 %.    PHYSICAL EXAMINATION:  GENERAL:  57 y.o.-year-old patient lying in the bed with no acute distress.  EYES: Pupils equal, round, reactive to light and accommodation. No scleral icterus. Extraocular muscles intact.  HEENT: Head atraumatic, normocephalic. Oropharynx and nasopharynx clear.  NECK:  Supple, no jugular venous distention. No thyroid enlargement, no tenderness.  LUNGS: Normal breath sounds bilaterally, no wheezing, rales,rhonchi or crepitation. No use of accessory muscles of respiration.  CARDIOVASCULAR: S1, S2 normal.  2 out of 6 systolic murmurs.  No rubs, or gallops.  ABDOMEN: Soft, non-tender, non-distended. Bowel sounds present. No organomegaly or mass.  EXTREMITIES: No pedal edema, cyanosis, or clubbing.  NEUROLOGIC: Cranial nerves II through XII are intact. Muscle strength 5/5 in all extremities. Sensation intact. Gait not checked.  PSYCHIATRIC: The patient is alert and oriented x 3.  SKIN: No obvious rash, lesion, or ulcer.   DATA REVIEW:   CBC Recent Labs  Lab 12/25/17 0822  WBC 7.0  HGB 17.5*  HCT 51.5  PLT 180    Chemistries  Recent Labs  Lab 12/25/17 0822  NA 139  K 4.4  CL 104  CO2 26  GLUCOSE 176*  BUN 17  CREATININE 0.90  CALCIUM 9.2  AST 22  ALT 20  ALKPHOS 56  BILITOT 0.9    Cardiac Enzymes Recent Labs  Lab 12/25/17 0822  TROPONINI <0.03      RADIOLOGY:  Ct Angio Head W Or Wo  Contrast  Result Date: 12/26/2017 CLINICAL DATA:  Chronic migraine with aura, prostatitis, and angina, now with RIGHT frontal headache and LEFT facial paresthesia. 3 mm acute infarct on MR, query migraine related, versus embolic stroke EXAM: CT ANGIOGRAPHY HEAD AND NECK TECHNIQUE: Multidetector CT imaging of the head and neck was performed using the standard protocol during bolus administration of intravenous contrast. Multiplanar CT image reconstructions and MIPs were obtained to evaluate the vascular anatomy. Carotid stenosis measurements (when applicable) are obtained utilizing NASCET criteria, using the distal internal carotid diameter as the denominator. CONTRAST:  44mL ISOVUE-370 IOPAMIDOL (ISOVUE-370) INJECTION 76% COMPARISON:  MRI brain 12/20/2017. FINDINGS: CT HEAD FINDINGS Brain: No evidence of acute infarction, hemorrhage, hydrocephalus, extra-axial collection or mass lesion/mass effect. Vascular: Reported separately Skull: Calvarium intact Sinuses: No sinus disease. Orbits: None. Review of the MIP images confirms the above findings CTA NECK FINDINGS Aortic arch:  Standard branching. Imaged portion shows no evidence of aneurysm or dissection. No significant stenosis of the major arch vessel origins. Right carotid system: No evidence of dissection, stenosis (50% or greater) or occlusion. Left carotid system: No evidence of dissection, stenosis (50% or greater) or occlusion. Vertebral arteries: Codominant. No evidence of dissection, stenosis (50% or greater) or occlusion. Skeleton: Mild spondylosis.  No worrisome osseous lesion. Other neck: No neck masses. Upper chest: No pneumothorax or mass. Review of the MIP images confirms the above findings CTA HEAD FINDINGS Anterior circulation: Calcification of the cavernous internal carotid arteries consistent with cerebrovascular atherosclerotic disease. No signs of intracranial large vessel occlusion, significant stenosis, or saccular aneurysm. Posterior  circulation: No significant stenosis, proximal occlusion, aneurysm, or vascular malformation. Hypoplastic basilar. Venous sinuses: As permitted by contrast timing, patent. Anatomic variants: BILATERAL fetal PCA. Delayed phase: No abnormal intracranial enhancement. Review of the MIP images confirms the above findings IMPRESSION: Unremarkable CTA head neck. No extracranial stenosis or ulceration. No intracranial flow reducing lesion. No abnormal postcontrast enhancement. Electronically Signed   By: Staci Righter M.D.   On: 12/26/2017 11:50   Ct Angio Neck W Or Wo Contrast  Result Date: 12/26/2017 CLINICAL DATA:  Chronic migraine with aura, prostatitis, and angina, now with RIGHT frontal headache and LEFT facial paresthesia. 3 mm acute infarct on MR, query migraine related, versus embolic stroke EXAM: CT ANGIOGRAPHY HEAD AND NECK TECHNIQUE: Multidetector CT imaging of the head and neck was performed using the standard protocol during bolus administration of intravenous contrast. Multiplanar CT image reconstructions and MIPs were obtained to evaluate the vascular anatomy. Carotid stenosis measurements (when applicable) are obtained utilizing NASCET criteria, using the distal internal carotid diameter as the denominator. CONTRAST:  75mL ISOVUE-370 IOPAMIDOL (ISOVUE-370) INJECTION 76% COMPARISON:  MRI brain 12/20/2017. FINDINGS: CT HEAD FINDINGS Brain: No evidence of acute infarction, hemorrhage, hydrocephalus, extra-axial collection or mass lesion/mass effect. Vascular: Reported separately Skull: Calvarium intact Sinuses: No sinus disease. Orbits: None. Review of the MIP images confirms the above findings CTA NECK FINDINGS Aortic arch: Standard branching. Imaged portion shows no evidence of aneurysm or dissection. No significant stenosis of the major arch vessel origins. Right carotid system: No evidence of dissection, stenosis (50% or greater) or occlusion. Left carotid system: No evidence of dissection, stenosis  (50% or greater) or occlusion. Vertebral arteries: Codominant. No evidence of dissection, stenosis (50% or greater) or occlusion. Skeleton: Mild spondylosis.  No worrisome osseous lesion. Other neck: No neck masses. Upper chest: No pneumothorax or mass. Review of the MIP images confirms the above findings CTA HEAD FINDINGS Anterior circulation: Calcification of the cavernous internal carotid arteries consistent with cerebrovascular atherosclerotic disease. No signs of intracranial large vessel occlusion, significant stenosis, or saccular aneurysm. Posterior circulation: No significant stenosis, proximal occlusion, aneurysm, or vascular malformation. Hypoplastic basilar. Venous sinuses: As permitted by contrast timing, patent. Anatomic variants: BILATERAL fetal PCA. Delayed phase: No abnormal intracranial enhancement. Review of the MIP images confirms the above findings IMPRESSION: Unremarkable CTA head neck. No extracranial stenosis or ulceration. No intracranial flow reducing lesion. No abnormal postcontrast enhancement. Electronically Signed   By: Staci Righter M.D.   On: 12/26/2017 11:50    Management plans discussed with the patient, family and they are in agreement.  CODE STATUS:     Code Status Orders  (From admission, onward)         Start     Ordered   12/25/17 1526  Full code  Continuous     12/25/17 1525  Code Status History    This patient has a current code status but no historical code status.      TOTAL TIME TAKING CARE OF THIS PATIENT: 34 minutes.    Loletha Grayer M.D on 12/26/2017 at 2:59 PM  Between 7am to 6pm - Pager - [phone]  After 6pm go to [email or web address] - password [email or web address] [facility]  [phone]  CC: Primary care physician; Guadalupe Maple, MD

## 2017-12-26 NOTE — Progress Notes (Signed)
OT Cancellation Note  Patient Details Name: JANIS CUFFE MRN: [id number] DOB: 06-09-1960   Cancelled Treatment:    Reason Eval/Treat Not Completed: OT screened, no needs identified, will sign off.  Order received and chart reviewed. Pt is at baseline and does not have any OT needs.  Order completed.  Chrys Racer, OTR/L ascom [phone] 12/26/17, 10:23 AM

## 2017-12-26 NOTE — Progress Notes (Signed)
SLP Cancellation Note  Patient Details Name: Geoffrey Ramirez MRN: [id number] DOB: 1960-08-01   Cancelled treatment:       Reason Eval/Treat Not Completed: SLP screened, no needs identified, will sign off(chart reviewed; consulted NSG then met w/ pt in room). Pt denied any difficulty swallowing and is currently on a regular diet; tolerates swallowing pills w/ water per NSG. Pt conversed at conversational level w/out deficits noted; pt denied any current speech-language deficits. He was texting on his personal phone w/ reported ease. No further skilled ST services indicated as pt appears at his baseline. Pt agreed. NSG to reconsult if any change in status.     Orinda Kenner, MS, CCC-SLP Watson,Katherine 12/26/2017, 11:07 AM

## 2017-12-26 NOTE — Progress Notes (Signed)
Pt alert and oriented x4, no complaints of pain or discomfort.  Bed in low position, call bell within reach.  Bed alarms on and functioning.  Assessment done and charted.  Will continue to monitor and do hourly rounding throughout the shift 

## 2017-12-26 NOTE — Progress Notes (Signed)
PT Cancellation Note  Patient Details Name: Geoffrey Ramirez MRN: [id number] DOB: 05-Sep-1960   Cancelled Treatment:    Reason Eval/Treat Not Completed: PT screened, no needs identified, will sign off.  Pt has been ambulating in the hallway without difficulty and denies unsteadiness.  Pt reports only residual symptom is tingling in L check area.  Denies any weakness or numbness/tingling elsewhere.  PT will sign off.     Collie Siad PT, DPT 12/26/2017, 12:52 PM

## 2017-12-26 NOTE — Progress Notes (Signed)
Subjective: Patient remains stable. No significant events noted overnight. Patient reports improvement in symptoms without residual effects. Denies headache but reports intermittent left facial numbness.  Objective: Current vital signs: BP (!) 150/87 (BP Location: Left Arm)   Pulse 68   Temp 97.9 F (36.6 C) (Oral)   Resp 16   Ht 6' (1.829 m)   Wt 85 kg   SpO2 99%   BMI 25.40 kg/m  Vital signs in last 24 hours: Temp:  [97.8 F (36.6 C)-98.4 F (36.9 C)] 97.9 F (36.6 C) (10/17 0832) Pulse Rate:  [51-85] 68 (10/17 0832) Resp:  [13-24] 16 (10/17 0832) BP: (108-153)/(68-90) 150/87 (10/17 0832) SpO2:  [96 %-99 %] 99 % (10/17 0832) FiO2 (%):  [21 %] 21 % (10/16 1526) Weight:  [85 kg] 85 kg (10/16 1523)  Intake/Output from previous day: No intake/output data recorded. Intake/Output this shift: No intake/output data recorded. Nutritional status:  Diet Order            Diet Heart Room service appropriate? Yes; Fluid consistency: Thin  Diet effective now              Neurologic Exam: Mental Status: Alert, oriented, thought content appropriate. Speech fluent without evidence of aphasia. Able to follow 3 step commands without difficulty. Attention span and concentration seemed appropriate  Cranial Nerves: II: Discs flat bilaterally; Visual fields grossly normal, pupils equal, round, reactive to light and accommodation III,IV, VI: ptosis not present, extra-ocular motions intact bilaterally V,VII: smile symmetric, facial light touch sensationmildly decreased on the left VIII: hearing normal bilaterally IX,X: gag reflex present XI: bilateral shoulder shrug XII: midline tongue extension Motor: Right :Upper extremity 5/5Without pronator driftLeft: Upper extremity 5/5 without pronator drift Right:Lower extremity 5/5Left: Lower extremity 5/5 Tone and bulk:normal tone throughout; no atrophy noted Sensory: Pinprick  and light touchintact bilaterally Deep Tendon Reflexes: 2+ and symmetric throughout Plantars: Right:downgoingLeft: downgoing Cerebellar: Finger-to-nosetesting intact bilaterally.Heel to shin testing normal bilaterally Gait: normal gait and station  Data Reviewed  Lab Results: Basic Metabolic Panel: Recent Labs  Lab 12/25/17 0822  NA 139  K 4.4  CL 104  CO2 26  GLUCOSE 176*  BUN 17  CREATININE 0.90  CALCIUM 9.2    Liver Function Tests: Recent Labs  Lab 12/25/17 0822  AST 22  ALT 20  ALKPHOS 56  BILITOT 0.9  PROT 7.5  ALBUMIN 4.2   No results for input(s): LIPASE, AMYLASE in the last 168 hours. No results for input(s): AMMONIA in the last 168 hours.  CBC: Recent Labs  Lab 12/25/17 0822  WBC 7.0  NEUTROABS 4.9  HGB 17.5*  HCT 51.5  MCV 92.1  PLT 180    Cardiac Enzymes: Recent Labs  Lab 12/25/17 0822  TROPONINI <0.03    Lipid Panel: Recent Labs  Lab 12/26/17 0409  CHOL 168  TRIG 116  HDL 40*  CHOLHDL 4.2  VLDL 23  LDLCALC 105*    CBG: No results for input(s): GLUCAP in the last 168 hours.  Microbiology: No results found for this or any previous visit.  Coagulation Studies: No results for input(s): LABPROT, INR in the last 72 hours.  Imaging: No results found.  Medications:  I have reviewed the patient's current medications. Prior to Admission:  Medications Prior to Admission  Medication Sig Dispense Refill Last Dose  . aspirin EC 81 MG tablet Take 81 mg by mouth daily.   12/25/2017 at 0630  . Cholecalciferol (VITAMIN D) 2000 units CAPS Take 2,000 Units by mouth  daily.    12/25/2017 at am  . finasteride (PROSCAR) 5 MG tablet TAKE 1 TABLET BY MOUTH EVERY DAY (Patient taking differently: Take 5 mg by mouth daily. ) 90 tablet 4 12/25/2017 at am  . ibuprofen (ADVIL,MOTRIN) 600 MG tablet Take 1 tablet (600 mg total) by mouth every 8 (eight) hours as needed. (Patient taking differently: Take 600 mg by mouth  every 8 (eight) hours as needed for headache or moderate pain. ) 60 tablet 1 unknown at unknown  . multivitamin (ONE-A-DAY MEN'S) TABS tablet Take 1 tablet by mouth daily.   12/25/2017 at am  . omega-3 acid ethyl esters (LOVAZA) 1 g capsule Take 2 g by mouth daily.   12/25/2017 at am  . Probiotic Product (ALIGN) 4 MG CAPS Take 4 mg by mouth daily.   12/25/2017 at am  . SUMAtriptan (IMITREX) 100 MG tablet Take 1 tablet (100 mg total) by mouth every 2 (two) hours as needed for migraine. Max 200mg  in 1 day 6 tablet 12 12/25/2017 at am  . vardenafil (LEVITRA) 20 MG tablet Take 1 tablet (20 mg total) by mouth daily as needed for erectile dysfunction. 6 tablet 12 unknown at unknown   Scheduled: . acidophilus  1 capsule Oral Daily  . aspirin  300 mg Rectal Daily   Or  . aspirin  325 mg Oral Daily  . atorvastatin  40 mg Oral q1800  . cholecalciferol  2,000 Units Oral Daily  . enoxaparin (LOVENOX) injection  40 mg Subcutaneous Q24H  . finasteride  5 mg Oral Daily  . multivitamin with minerals  1 tablet Oral Daily   Assessment: 57 y.o. male presenting with right frontal headaches, left-sided facial paresthesias and left ear fullness found to have acute right frontal cortex infarct. Etiology concerning for embolic stroke but maybe due to chronic migraine. Symptoms resolved without residual effect. CTA head and neck reviewed and showed no large vessel occlusion.. Echocardiogram with bubble study shows possible PFO or ASD. Hgb A1c 5.3, LDL 105  Plan: 1. Continue Aspirin 81 mg/day with intensive management of vascular risk factor to keep systolic BP (SBP) <161 mm Hg (130 mm Hg if diabetic)  2 Start statin with goal low density lipoprotein (LDL) <70 mg/dl,  3. PT consult, OT consult, Speech consult 4. Needs follow up with outpatient neurology as scheduled 5. OTC vitamin B complex and magnesium 400-600 mg/day for migraine prevention.  This patient was staffed with Dr. Irish Elders, Alease Frame who personally  evaluated patient, reviewed documentation and agreed with assessment and plan of care as above.  Rufina Falco, DNP, FNP-BC Board certified Nurse Practitioner Neurology Department   LOS: 0 days   @MPKSIGN @ 12/26/2017  8:42 AM

## 2017-12-26 NOTE — Progress Notes (Signed)
*  PRELIMINARY RESULTS* Echocardiogram 2D Echocardiogram has been performed.  Geoffrey Ramirez 12/26/2017, 9:48 AM

## 2017-12-26 NOTE — Progress Notes (Signed)
OT Cancellation Note  Patient Details Name: ERBIE ARMENT MRN: [id number] DOB: 1961/02/01   Cancelled Treatment:    Reason Eval/Treat Not Completed: Patient at procedure or test/ unavailable Order received and chart reviewed.  Pt getting Echo/Bubble study and not avialable for OT evaluation.  Will attempt later today.  Thank you for the referral.  Chrys Racer, OTR/L ascom [phone] 12/26/17, 9:20 AM

## 2017-12-26 NOTE — Progress Notes (Signed)
Discharge: Pt d/c from room via wheelchair, Family member with the pt. Discharge instructions given to the patient and family members.  No questions from pt, reintegrated to the pt to call or go to the ED for chest discomfort. Pt dressed in street clothes and left with discharge papers and prescriptions in hand. IV d/ced, tele removed and no complaints of pain or discomfort. 

## 2017-12-27 ENCOUNTER — Telehealth: Payer: Self-pay

## 2017-12-27 LAB — HIV ANTIBODY (ROUTINE TESTING W REFLEX): HIV Screen 4th Generation wRfx: NONREACTIVE

## 2017-12-27 NOTE — Telephone Encounter (Signed)
Transition Care Management Follow-up Telephone Call  Date of discharge and from where: 12/26/2017 from [facility]   How have you been since you were released from the hospital? "just very tired today"  Any questions or concerns? Yes , just wanted to make sure Dr.Crissman is going to send refills on the medications they prescribed at the hospital. Informed pt this would be reviewed at the hospital follow up appt.   Items Reviewed:  Did the pt receive and understand the discharge instructions provided? Yes   Medications obtained and verified? Yes   Any new allergies since your discharge? No   Dietary orders reviewed? Yes  Do you have support at home? Yes   Functional Questionnaire: (I = Independent and D = Dependent) ADLs:   Bathing/Dressing- I  Meal Prep- i  Eating- I  Maintaining continence- i  Transferring/Ambulation- i  Managing Meds- i  Follow up appointments reviewed:   PCP Hospital f/u appt confirmed? Yes  Scheduled to see Dr.Johnson (per pt request) on 12/31/2017 @ 2:30pm.  [facility] f/u appt confirmed?   Are transportation arrangements needed? No   If their condition worsens, is the pt aware to call PCP or go to the Emergency Dept.? Yes  Was the patient provided with contact information for the PCP's office or ED? Yes  Was to pt encouraged to call back with questions or concerns? Yes

## 2017-12-31 ENCOUNTER — Encounter: Payer: Self-pay | Admitting: Family Medicine

## 2017-12-31 ENCOUNTER — Ambulatory Visit (INDEPENDENT_AMBULATORY_CARE_PROVIDER_SITE_OTHER): Payer: BLUE CROSS/BLUE SHIELD | Admitting: Family Medicine

## 2017-12-31 ENCOUNTER — Encounter: Payer: BLUE CROSS/BLUE SHIELD | Admitting: Family Medicine

## 2017-12-31 VITALS — BP 129/83 | HR 68 | Wt 187.0 lb

## 2017-12-31 DIAGNOSIS — Z8673 Personal history of transient ischemic attack (TIA), and cerebral infarction without residual deficits: Secondary | ICD-10-CM | POA: Diagnosis not present

## 2017-12-31 MED ORDER — LORATADINE 10 MG PO TABS: 10.0000 mg | ORAL_TABLET | Freq: Every day | ORAL | 3 refills | Status: DC

## 2017-12-31 MED ORDER — CLOPIDOGREL BISULFATE 75 MG PO TABS: 75.0000 mg | ORAL_TABLET | Freq: Every day | ORAL | 3 refills | Status: DC

## 2017-12-31 NOTE — Assessment & Plan Note (Signed)
No symptoms. Feeling well besides worn out. Possibly from starting atorvastatin. Recheck with PCP at physical in 2 weeks. Continue current regimen. To see neurology on 10/24 and cardiology on 10/25. Call with any concerns.

## 2017-12-31 NOTE — Progress Notes (Signed)
BP 129/83   Pulse 68   Wt 187 lb (84.8 kg)   SpO2 97%   BMI 25.36 kg/m    Subjective:    Patient ID: Geoffrey Ramirez, male    DOB: 11/01/1960, 57 y.o.   MRN: [id number]  HPI: Geoffrey Ramirez is a 57 y.o. male  Chief Complaint  Patient presents with  . Hospitalization Follow-up    Still not feeling well. Has upcoming OVs with Cardiology and Neurology this week   Transition of [facility] Follow up.   Hospital/Facility: [facility] D/C Physician: Dr. Leslye Peer D/C Date: 12/26/17  Records Requested:  12/26/17 Records Received: 12/26/17 Records Reviewed: 12/27/17  Diagnoses on Discharge: CVA  Date of interactive Contact within 48 hours of discharge: 12/27/17 Contact was through: phone  Date of 7 day or 14 day face-to-face visit: 12/31/17  within 7 days  Outpatient Encounter Medications as of 12/31/2017  Medication Sig  . aspirin EC 81 MG tablet Take 81 mg by mouth daily.  Marland Kitchen atorvastatin (LIPITOR) 40 MG tablet Take 1 tablet (40 mg total) by mouth daily at 6 PM.  . Cholecalciferol (VITAMIN D) 2000 units CAPS Take 2,000 Units by mouth daily.   . clopidogrel (PLAVIX) 75 MG tablet Take 1 tablet (75 mg total) by mouth daily.  . finasteride (PROSCAR) 5 MG tablet TAKE 1 TABLET BY MOUTH EVERY DAY (Patient taking differently: Take 5 mg by mouth daily. )  . loratadine (CLARITIN) 10 MG tablet Take 1 tablet (10 mg total) by mouth daily.  . multivitamin (ONE-A-DAY MEN'S) TABS tablet Take 1 tablet by mouth daily.  Marland Kitchen omega-3 acid ethyl esters (LOVAZA) 1 g capsule Take 2 g by mouth daily.  . Probiotic Product (ALIGN) 4 MG CAPS Take 4 mg by mouth daily.  . SUMAtriptan (IMITREX) 100 MG tablet Take 1 tablet (100 mg total) by mouth every 2 (two) hours as needed for migraine. Max 200mg  in 1 day  . [DISCONTINUED] clopidogrel (PLAVIX) 75 MG tablet Take 1 tablet (75 mg total) by mouth daily.  . [DISCONTINUED] loratadine (CLARITIN) 10 MG tablet Take 1 tablet (10 mg total) by mouth daily.   No  facility-administered encounter medications on file as of 12/31/2017.    Per Hospitalist:  " HOSPITAL COURSE:   1.  Acute CVA with 3 mm focus right frontal cortex seen on prior MRI.  CT angiogram of the neck brain showed no blockages.  Echocardiogram with bubble study showed that the atrium septum was agitated with saline which suggest a right to left atrial shunt.  Imaging suboptimal for patent foramen ovale or atrial septal defect.  Case discussed with neurology and they recommended aspirin and Plavix and follow-up as outpatient.  Also prescribed Lipitor.  Follow-up with neurology and cardiology as outpatient.  Consideration for TEE as outpatient. 2.  Hyperlipidemia unspecified.  LDL 105.  Goal more aggressively less than 70. 3.  History of migraine.  Follow-up with neurology as outpatient"  Diagnostic Tests Reviewed:  CLINICAL DATA:  Chronic migraine with aura, prostatitis, and angina, now with RIGHT frontal headache and LEFT facial paresthesia. 3 mm acute infarct on MR, query migraine related, versus embolic stroke  EXAM: CT ANGIOGRAPHY HEAD AND NECK  TECHNIQUE: Multidetector CT imaging of the head and neck was performed using the standard protocol during bolus administration of intravenous contrast. Multiplanar CT image reconstructions and MIPs were obtained to evaluate the vascular anatomy. Carotid stenosis measurements (when applicable) are obtained utilizing NASCET criteria, using the distal internal carotid diameter  as the denominator.  CONTRAST:  43mL ISOVUE-370 IOPAMIDOL (ISOVUE-370) INJECTION 76%  COMPARISON:  MRI brain 12/20/2017.  FINDINGS: CT HEAD FINDINGS  Brain: No evidence of acute infarction, hemorrhage, hydrocephalus, extra-axial collection or mass lesion/mass effect.  Vascular: Reported separately  Skull: Calvarium intact  Sinuses: No sinus disease.  Orbits: None.  Review of the MIP images confirms the above findings  CTA NECK  FINDINGS  Aortic arch: Standard branching. Imaged portion shows no evidence of aneurysm or dissection. No significant stenosis of the major arch vessel origins.  Right carotid system: No evidence of dissection, stenosis (50% or greater) or occlusion.  Left carotid system: No evidence of dissection, stenosis (50% or greater) or occlusion.  Vertebral arteries: Codominant. No evidence of dissection, stenosis (50% or greater) or occlusion.  Skeleton: Mild spondylosis.  No worrisome osseous lesion.  Other neck: No neck masses.  Upper chest: No pneumothorax or mass.  Review of the MIP images confirms the above findings  CTA HEAD FINDINGS  Anterior circulation: Calcification of the cavernous internal carotid arteries consistent with cerebrovascular atherosclerotic disease. No signs of intracranial large vessel occlusion, significant stenosis, or saccular aneurysm.  Posterior circulation: No significant stenosis, proximal occlusion, aneurysm, or vascular malformation. Hypoplastic basilar.  Venous sinuses: As permitted by contrast timing, patent.  Anatomic variants: BILATERAL fetal PCA.  Delayed phase: No abnormal intracranial enhancement.  Review of the MIP images confirms the above findings  IMPRESSION: Unremarkable CTA head neck. No extracranial stenosis or ulceration. No intracranial flow reducing lesion. No abnormal postcontrast Enhancement.  [facility]*                       [place], [place] [address]                            [phone]  ------------------------------------------------------------------- Transthoracic Echocardiography  Patient:    Geoffrey Ramirez, Geoffrey Ramirez MR #:       [id number] Study Date: 12/25/2017 Gender:     M Age:        51 Height:     182.9 cm Weight:     85 kg BSA:        2.09 m^2 Pt. Status: Room:       123A   ATTENDING    Marisue Brooklyn  SONOGRAPHER   Cindy Hazy, RDCS  ADMITTING    Chen, [place], [place]  REFERRING    Bridgett Larsson, [place]  PERFORMING   [facility]  cc:  ------------------------------------------------------------------- LV EF: 55% -   60%  ------------------------------------------------------------------- Indications:      G45.9 TIA.  ------------------------------------------------------------------- History:   PMH:  Acquired from the patient and from the patient&'s chart. No prior cardiac history.  ------------------------------------------------------------------- Study Conclusions  - Left ventricle: The cavity size was normal. Wall thickness was   normal. Systolic function was normal. The estimated ejection   fraction was in the range of 55% to 60%. Wall motion was normal;   there were no regional wall motion abnormalities. Left   ventricular diastolic function parameters were normal. - Right ventricle: The cavity size was at the upper limits of   normal. Systolic function was normal.  ------------------------------------------------------------------- Study data:   Study status:  Routine.  Procedure:  The patient reported no pain pre or post test. Transthoracic echocardiography for left ventricular function evaluation, for right ventricular function evaluation, and for assessment of valvular function. Image quality was adequate.  Study completion:  There were no complications.          Transthoracic echocardiography.  M-mode, complete 2D, spectral Doppler, and color Doppler.  Birthdate: Patient birthdate: 01-26-1961.  Age:  Patient is 57 yr old.  Sex: Gender: male.    BMI: 25.4 kg/m^2.  Blood pressure:     130/90 Patient status:  Inpatient.  Study date:  Study date: 12/25/2017. Study time: 09:21 PM.  Location:  Bedside.  -------------------------------------------------------------------  ------------------------------------------------------------------- Left ventricle:   The cavity size was normal. Wall thickness was normal. Systolic function was normal. The estimated ejection fraction was in the range of 55% to 60%. Wall motion was normal; there were no regional wall motion abnormalities. Left ventricular diastolic function parameters were normal.  ------------------------------------------------------------------- Aortic valve:   Trileaflet. Cusp separation was normal.  Doppler: Transvalvular velocity was within the normal range. There was no stenosis. There was no regurgitation.  ------------------------------------------------------------------- Aorta:  Aortic root: The aortic root was normal in size. Ascending aorta: The ascending aorta was normal in size.  ------------------------------------------------------------------- Mitral valve:   Structurally normal valve.   Leaflet separation was normal.  Doppler:  Transvalvular velocity was within the normal range. There was no evidence for stenosis. There was trivial regurgitation.    Peak gradient (D): 3 mm Hg.  ------------------------------------------------------------------- Left atrium:  The atrium was normal in size.  ------------------------------------------------------------------- Right ventricle:  The cavity size was at the upper limits of normal. Systolic function was normal.  ------------------------------------------------------------------- Pulmonic valve:   Poorly visualized.  Doppler:  Transvalvular velocity was within the normal range. There was no evidence for stenosis. There was no significant regurgitation.  ------------------------------------------------------------------- Tricuspid valve:   Structurally normal valve.   Leaflet separation was normal.  Doppler:  Transvalvular velocity was within the normal range. There was trivial regurgitation.  ------------------------------------------------------------------- Pulmonary artery:   Poorly visualized.  Systolic  pressure could not be accurately estimated.  ------------------------------------------------------------------- Right atrium:  The atrium was normal in size.  ------------------------------------------------------------------- Pericardium:  There was no pericardial effusion.  ------------------------------------------------------------------- Systemic veins: Inferior vena cava: Not well visualized.  [facility]                       [place],  [address]                            [phone]  ------------------------------------------------------------------- Transthoracic Echocardiography  Patient:    Geoffrey Ramirez, Geoffrey Ramirez MR #:       [id number] Study Date: 12/26/2017 Gender:     M Age:        60 Height: Weight: BSA: Pt. Status: Room:   SONOGRAPHER  Sherrie Sport RDCS  PERFORMING   [facility]  ATTENDING    Ouma, [place]     Rufina Falco Achieng  REFERRING    Rufina Falco Achieng  cc:  -------------------------------------------------------------------  ------------------------------------------------------------------- Indications:     Stroke 434.91-- limited echo to include only saline bubble study.  ------------------------------------------------------------------- Study Conclusions  - Atrial septum: Agitated saline contrast study suggests a   right-to-left atrial level shunt, following an  increase in RA   pressure induced by provocative maneuvers. Image resulotion is   suboptimal, but findings raise the possibility of a patent   foramen ovale or atrial septal defect.  ------------------------------------------------------------------- Study data:   Procedure:  Transthoracic echocardiography. Image quality was adequate.          Transthoracic echocardiography. M-mode, limited 2D, limited spectral Doppler, and color Doppler. Birthdate:  Patient birthdate:  10-03-1960.  Age:  Patient is 57 yr old.  Sex:  Gender: male.  Patient status:  Inpatient.  Study date:  Study date: 12/26/2017. Study time: 09:23 AM.  Location:  Bedside.   -------------------------------------------------------------------  ------------------------------------------------------------------- Atrial septum:  Agitated saline contrast study suggests a right-to-left atrial level shunt, following an increase in RA pressure induced by provocative maneuvers. There is no evidence of shunting in the baseline state.  Disposition: Home  Consults:  Neurology  Discharge Instructions: Follow up here, with cardiology and neurology  Disease/illness Education:  In writing   Home Health/Community Services Discussions/Referrals: N/A  Establishment or re-establishment of referral orders for community resources: N/A  Discussion with other health care providers: None  Assessment and Support of treatment regimen adherence: Good  Appointments Coordinated with: Patient  Ry notes that he is not really feeling like himself. He has been feeling run down. Some numbness in his face. Taking claritin. No migraines since getting out of the hospital. No weakness. Feeling well otherwise.   Relevant past medical, surgical, family and social history reviewed and updated as indicated. Interim medical history since our last visit reviewed. Allergies and medications reviewed and updated.  Review of Systems  Constitutional: Negative.   Respiratory: Negative.   Cardiovascular: Negative.   Skin: Negative.   Neurological: Negative.   Psychiatric/Behavioral: Negative.     Per HPI unless specifically indicated above     Objective:    BP 129/83   Pulse 68   Wt 187 lb (84.8 kg)   SpO2 97%   BMI 25.36 kg/m   Wt Readings from Last 3 Encounters:  12/31/17 187 lb (84.8 kg)  12/25/17 187 lb 4.8 oz (85 kg)  11/29/17 191 lb 8 oz (86.9 kg)    Physical Exam  Constitutional: He is oriented  to person, place, and time. He appears well-developed and well-nourished. No distress.  HENT:  Head: Normocephalic and atraumatic.  Right Ear: Hearing normal.  Left Ear: Hearing normal.  Nose: Nose normal.  Eyes: Conjunctivae and lids are normal. Right eye exhibits no discharge. Left eye exhibits no discharge. No scleral icterus.  Cardiovascular: Normal rate, regular rhythm, normal heart sounds and intact distal pulses. Exam reveals no gallop and no friction rub.  No murmur heard. Pulmonary/Chest: Effort normal and breath sounds normal. No stridor. No respiratory distress. He has no wheezes. He has no rales. He exhibits no tenderness.  Musculoskeletal: Normal range of motion.  Neurological: He is alert and oriented to person, place, and time. He displays normal reflexes. No cranial nerve deficit or sensory deficit. He exhibits normal muscle tone. Coordination normal.  Skin: Skin is warm, dry and intact. Capillary refill takes less than 2 seconds. No rash noted. He is not diaphoretic. No erythema. No pallor.  Psychiatric: He has a normal mood and affect. His speech is normal and behavior is normal. Judgment and thought content normal. Cognition and memory are normal.  Nursing note and vitals reviewed.   Results for orders placed or performed during the hospital encounter of 12/25/17  CBC with Differential  Result Value Ref Range  WBC 7.0 4.0 - 10.5 K/uL   RBC 5.59 4.22 - 5.81 MIL/uL   Hemoglobin 17.5 (H) 13.0 - 17.0 g/dL   HCT 51.5 39.0 - 52.0 %   MCV 92.1 80.0 - 100.0 fL   MCH 31.3 26.0 - 34.0 pg   MCHC 34.0 30.0 - 36.0 g/dL   RDW 13.0 11.5 - 15.5 %   Platelets 180 150 - 400 K/uL   nRBC 0.0 0.0 - 0.2 %   Neutrophils Relative % 72 %   Neutro Abs 4.9 1.7 - 7.7 K/uL   Lymphocytes Relative 22 %   Lymphs Abs 1.6 0.7 - 4.0 K/uL   Monocytes Relative 6 %   Monocytes Absolute 0.4 0.1 - 1.0 K/uL   Eosinophils Relative 0 %   Eosinophils Absolute 0.0 0.0 - 0.5 K/uL   Basophils Relative 0 %    Basophils Absolute 0.0 0.0 - 0.1 K/uL   Immature Granulocytes 0 %   Abs Immature Granulocytes 0.03 0.00 - 0.07 K/uL  Comprehensive metabolic panel  Result Value Ref Range   Sodium 139 135 - 145 mmol/L   Potassium 4.4 3.5 - 5.1 mmol/L   Chloride 104 98 - 111 mmol/L   CO2 26 22 - 32 mmol/L   Glucose, Bld 176 (H) 70 - 99 mg/dL   BUN 17 6 - 20 mg/dL   Creatinine, Ser 0.90 0.61 - 1.24 mg/dL   Calcium 9.2 8.9 - 10.3 mg/dL   Total Protein 7.5 6.5 - 8.1 g/dL   Albumin 4.2 3.5 - 5.0 g/dL   AST 22 15 - 41 U/L   ALT 20 0 - 44 U/L   Alkaline Phosphatase 56 38 - 126 U/L   Total Bilirubin 0.9 0.3 - 1.2 mg/dL   GFR calc non Af Amer >60 >60 mL/min   GFR calc Af Amer >60 >60 mL/min   Anion gap 9 5 - 15  Troponin I  Result Value Ref Range   Troponin I <0.03 <0.03 ng/mL  Urinalysis, Complete w Microscopic  Result Value Ref Range   Color, Urine STRAW (A) YELLOW   APPearance CLEAR (A) CLEAR   Specific Gravity, Urine 1.005 1.005 - 1.030   pH 5.0 5.0 - 8.0   Glucose, UA 50 (A) NEGATIVE mg/dL   Hgb urine dipstick NEGATIVE NEGATIVE   Bilirubin Urine NEGATIVE NEGATIVE   Ketones, ur NEGATIVE NEGATIVE mg/dL   Protein, ur NEGATIVE NEGATIVE mg/dL   Nitrite NEGATIVE NEGATIVE   Leukocytes, UA NEGATIVE NEGATIVE   RBC / HPF 0-5 0 - 5 RBC/hpf   WBC, UA 0-5 0 - 5 WBC/hpf   Bacteria, UA NONE SEEN NONE SEEN   Squamous Epithelial / LPF NONE SEEN 0 - 5   Mucus PRESENT   HIV antibody (Routine Testing)  Result Value Ref Range   HIV Screen 4th Generation wRfx Non Reactive Non Reactive  Hemoglobin A1c  Result Value Ref Range   Hgb A1c MFr Bld 5.3 4.8 - 5.6 %   Mean Plasma Glucose 105.41 mg/dL  Lipid panel  Result Value Ref Range   Cholesterol 168 0 - 200 mg/dL   Triglycerides 116 <150 mg/dL   HDL 40 (L) >40 mg/dL   Total CHOL/HDL Ratio 4.2 RATIO   VLDL 23 0 - 40 mg/dL   LDL Cholesterol 105 (H) 0 - 99 mg/dL  ECHOCARDIOGRAM COMPLETE  Result Value Ref Range   Weight 2,996.8 oz   Height 72 in   BP  130/90 mmHg      Assessment &  Plan:   Problem List Items Addressed This Visit      Other   History of CVA (cerebrovascular accident) without residual deficits - Primary    No symptoms. Feeling well besides worn out. Possibly from starting atorvastatin. Recheck with PCP at physical in 2 weeks. Continue current regimen. To see neurology on 10/24 and cardiology on 10/25. Call with any concerns.           Follow up plan: Return if symptoms worsen or fail to improve.

## 2017-12-31 NOTE — Patient Instructions (Signed)
Stroke Prevention Some medical conditions and behaviors are associated with a higher chance of having a stroke. You can help prevent a stroke by making nutrition, lifestyle, and other changes, including managing any medical conditions you may have. What nutrition changes can be made?  Eat healthy foods. You can do this by: ? Choosing foods high in fiber, such as fresh fruits and vegetables and whole grains. ? Eating at least 5 or more servings of fruits and vegetables a day. Try to fill half of your plate at each meal with fruits and vegetables. ? Choosing lean protein foods, such as lean cuts of meat, poultry without skin, fish, tofu, beans, and nuts. ? Eating low-fat dairy products. ? Avoiding foods that are high in salt (sodium). This can help lower blood pressure. ? Avoiding foods that have saturated fat, trans fat, and cholesterol. This can help prevent high cholesterol. ? Avoiding processed and premade foods.  Follow your health care provider's specific guidelines for losing weight, controlling high blood pressure (hypertension), lowering high cholesterol, and managing diabetes. These may include: ? Reducing your daily calorie intake. ? Limiting your daily sodium intake to 1,500 milligrams (mg). ? Using only healthy fats for cooking, such as olive oil, canola oil, or sunflower oil. ? Counting your daily carbohydrate intake. What lifestyle changes can be made?  Maintain a healthy weight. Talk to your health care provider about your ideal weight.  Get at least 30 minutes of moderate physical activity at least 5 days a week. Moderate activity includes brisk walking, biking, and swimming.  Do not use any products that contain nicotine or tobacco, such as cigarettes and e-cigarettes. If you need help quitting, ask your health care provider. It may also be helpful to avoid exposure to secondhand smoke.  Limit alcohol intake to no more than 1 drink a day for nonpregnant women and 2 drinks a  day for men. One drink equals 12 oz of beer, 5 oz of wine, or 1 oz of hard liquor.  Stop any illegal drug use.  Avoid taking birth control pills. Talk to your health care provider about the risks of taking birth control pills if: ? You are over 50 years old. ? You smoke. ? You get migraines. ? You have ever had a blood clot. What other changes can be made?  Manage your cholesterol levels. ? Eating a healthy diet is important for preventing high cholesterol. If cholesterol cannot be managed through diet alone, you may also need to take medicines. ? Take any prescribed medicines to control your cholesterol as told by your health care provider.  Manage your diabetes. ? Eating a healthy diet and exercising regularly are important parts of managing your blood sugar. If your blood sugar cannot be managed through diet and exercise, you may need to take medicines. ? Take any prescribed medicines to control your diabetes as told by your health care provider.  Control your hypertension. ? To reduce your risk of stroke, try to keep your blood pressure below 130/80. ? Eating a healthy diet and exercising regularly are an important part of controlling your blood pressure. If your blood pressure cannot be managed through diet and exercise, you may need to take medicines. ? Take any prescribed medicines to control hypertension as told by your health care provider. ? Ask your health care provider if you should monitor your blood pressure at home. ? Have your blood pressure checked every year, even if your blood pressure is normal. Blood pressure increases with  age and some medical conditions.  Get evaluated for sleep disorders (sleep apnea). Talk to your health care provider about getting a sleep evaluation if you snore a lot or have excessive sleepiness.  Take over-the-counter and prescription medicines only as told by your health care provider. Aspirin or blood thinners (antiplatelets or  anticoagulants) may be recommended to reduce your risk of forming blood clots that can lead to stroke.  Make sure that any other medical conditions you have, such as atrial fibrillation or atherosclerosis, are managed. What are the warning signs of a stroke? The warning signs of a stroke can be easily remembered as BEFAST.  B is for balance. Signs include: ? Dizziness. ? Loss of balance or coordination. ? Sudden trouble walking.  E is for eyes. Signs include: ? A sudden change in vision. ? Trouble seeing.  F is for face. Signs include: ? Sudden weakness or numbness of the face. ? The face or eyelid drooping to one side.  A is for arms. Signs include: ? Sudden weakness or numbness of the arm, usually on one side of the body.  S is for speech. Signs include: ? Trouble speaking (aphasia). ? Trouble understanding.  T is for time. ? These symptoms may represent a serious problem that is an emergency. Do not wait to see if the symptoms will go away. Get medical help right away. Call your local emergency services (911 in the [place] Do not drive yourself to the hospital.  Other signs of stroke may include: ? A sudden, severe headache with no known cause. ? Nausea or vomiting. ? Seizure.  Where to find more information: For more information, visit:  American Stroke Association: www.strokeassociation.org  National Stroke Association: www.stroke.org  Summary  You can prevent a stroke by eating healthy, exercising, not smoking, limiting alcohol intake, and managing any medical conditions you may have.  Do not use any products that contain nicotine or tobacco, such as cigarettes and e-cigarettes. If you need help quitting, ask your health care provider. It may also be helpful to avoid exposure to secondhand smoke.  Remember BEFAST for warning signs of stroke. Get help right away if you or a loved one has any of these signs. This information is not intended to replace advice given  to you by your health care provider. Make sure you discuss any questions you have with your health care provider. Document Released: 04/05/2004 Document Revised: 04/03/2016 Document Reviewed: 04/03/2016 Elsevier Interactive Patient Education  2018 [employer].  Ischemic Stroke An ischemic stroke (cerebrovascular accident, or CVA) is the sudden death of brain tissue that occurs when an area of the brain does not get enough oxygen. It is a medical emergency that must be treated right away. An ischemic stroke can cause permanent loss of brain function. This can cause problems with how different parts of your body function. What are the causes? This condition is caused by a decrease of oxygen supply to an area of the brain, which may be the result of:  A small blood clot (embolus) or a buildup of plaque in the blood vessels (atherosclerosis) that blocks blood flow in the brain.  An abnormal heart rhythm (atrial fibrillation).  A blocked or damaged artery in the head or neck.  What increases the risk? Certain factors may make you more likely to develop this condition. Some of these factors are things that you can change, such as:  Obesity.  Smoking cigarettes.  Taking oral birth control, especially if you also use  tobacco.  Physical inactivity.  Excessive alcohol use.  Use of illegal drugs, especially cocaine and methamphetamine.  Other risk factors include:  High blood pressure (hypertension).  High cholesterol.  Diabetes mellitus.  Heart disease.  Being [place] American, Native American, Hispanic, or [place] Native.  Being over age 67.  Family history of stroke.  Previous history of blood clots, stroke, or transient ischemic attack (TIA).  Sickle cell disease.  Being a woman with a history of preeclampsia.  Migraine headache.  Sleep apnea.  Irregular heartbeats, such as atrial fibrillation.  Chronic inflammatory diseases, such as rheumatoid arthritis or  lupus.  Blood clotting disorders (hypercoagulable state).  What are the signs or symptoms? Symptoms of this condition usually develop suddenly, or you may notice them after waking up from sleep. Symptoms may include sudden:  Weakness or numbness in your face, arm, or leg, especially on one side of your body.  Trouble walking or difficulty moving your arms or legs.  Loss of balance or coordination.  Confusion.  Slurred speech (dysarthria).  Trouble speaking, understanding speech, or both (aphasia).  Vision changes-such as double vision, blurred vision, or loss of vision-inone or both eyes.  Dizziness.  Nausea and vomiting.  Severe headache with no known cause. The headache is often described as the worst headache ever experienced.  If possible, make note of the exact time that you last felt like your normal self and what time your symptoms started. Tell your health care provider. If symptoms come and go, this could be a sign of a warning stroke, or TIA. Get help right away, even if you feel better. How is this diagnosed? This condition may be diagnosed based on:  Your symptoms, your medical history, and a physical exam.  CT scan of the brain.  MRI.  CT angiogram. This test uses a computer to take X-rays of your arteries. A dye may be injected into your blood to show the inside of your blood vessels more clearly.  MRI angiogram. This is a type of MRI that is used to evaluate the blood vessels.  Cerebral angiogram. This test uses X-rays and a dye to show the blood vessels in the brain and neck.  You may need to see a health care provider who specializes in stroke care. A stroke specialist can be seen in person or through communication using telephone or television technology (telemedicine). Other tests may also be done to find the cause of the stroke, such as:  Electrocardiogram (ECG).  Continuous heart monitoring.  Echocardiogram.  Carotid ultrasound.  A scan of  the brain circulation.  Blood tests.  Sleep study to check for sleep apnea.  How is this treated? Treatment for this condition will depend on the duration, severity, and cause of your symptoms and on the area of the brain affected. It is very important to get treatment at the first sign of stroke symptoms. Some treatments work better if they are done within 3-6 hours of the onset of stroke symptoms. These initial treatments may include:  Aspirin.  Medicines to control blood pressure.  Medicine given by injection to dissolve the blood clot (thrombolytic).  Treatments given directly to the affected artery to remove or dissolve the blood clot.  Other treatment options may include:  Oxygen.  IV fluids.  Medicines to thin the blood (anticoagulants or antiplatelets).  Procedures to increase blood flow.  Medicines and changes to your diet may be used to help treat and manage risk factors for stroke, such as diabetes,  high cholesterol, and high blood pressure. After a stroke, you may work with physical, speech, mental health, or occupational therapists to help you recover. Follow these instructions at home: Medicines  Take over-the-counter and prescription medicines only as told by your health care provider.  If you were told to take a medicine to thin your blood, such as aspirin or an anticoagulant, take it exactly as told by your health care provider. ? Taking too much blood-thinning medicine can cause bleeding. ? If you do not take enough blood-thinning medicine, you will not have the protection that you need against another stroke and other problems.  Understand the side effects of taking anticoagulant medicine. When taking this type of medicine, make sure you: ? Hold pressure over any cuts for longer than usual. ? Tell your dentist and other health care providers that you are taking anticoagulants before you have any procedures that may cause bleeding. ? Avoid activities that  may cause trauma or injury. Eating and drinking  Follow instructions from your health care provider about diet.  Eat healthy foods.  If your ability to swallow was affected by the stroke, you may need to take steps to avoid choking, such as: ? Taking small bites when eating. ? Eating foods that are soft or pureed. Safety  Follow instructions from your health care team about physical activity.  Use a walker or cane as told by your health care provider.  Take steps to create a safe home environment in order to reduce the risk of falls. This may include: ? Having your home looked at by specialists. ? Installing grab bars in the bedroom and bathroom. ? Using safety equipment, such as raised toilets and a seat in the shower. General instructions  Do not use any tobacco products, such as cigarettes, chewing tobacco, and e-cigarettes. If you need help quitting, ask your health care provider.  Limit alcohol intake to no more than 1 drink a day for nonpregnant women and 2 drinks a day for men. One drink equals 12 oz of beer, 5 oz of wine, or 1 oz of hard liquor.  If you need help to stop using drugs or alcohol, ask your health care provider about a referral to a program or specialist.  Maintain an active and healthy lifestyle. Get regular exercise as told by your health care provider.  Keep all follow-up visits as told by your health care provider, including visits with all specialists on your health care team. This is important. How is this prevented? Your risk of another stroke can be decreased by managing high blood pressure, high cholesterol, diabetes, heart disease, sleep apnea, and obesity. It can also be decreased by quitting smoking, limiting alcohol, and staying physically active. Your health care provider will continue to work with you on measures to prevent short-term and long-term complications of stroke. Get help right away if: You have:  Sudden weakness or numbness in your  face, arm, or leg, especially on one side of your body.  Sudden confusion.  Sudden trouble speaking, understanding, or both (aphasia).  Sudden trouble seeing with one or both eyes.  Sudden trouble walking or difficulty moving your arms or legs.  Sudden dizziness.  Sudden loss of balance or coordination.  Sudden, severe headache with no known cause.  A partial or total loss of consciousness.  A seizure. Any of these symptoms may represent a serious problem that is an emergency. Do not wait to see if the symptoms will go away. Get medical help  right away. Call your local emergency services (911 in [place] Do not drive yourself to the hospital. This information is not intended to replace advice given to you by your health care provider. Make sure you discuss any questions you have with your health care provider. Document Released: 02/26/2005 Document Revised: 08/09/2015 Document Reviewed: 05/25/2015 Elsevier Interactive Patient Education  [employer].

## 2018-01-02 DIAGNOSIS — Z8673 Personal history of transient ischemic attack (TIA), and cerebral infarction without residual deficits: Secondary | ICD-10-CM | POA: Diagnosis not present

## 2018-01-02 DIAGNOSIS — R51 Headache: Secondary | ICD-10-CM | POA: Diagnosis not present

## 2018-01-03 DIAGNOSIS — E782 Mixed hyperlipidemia: Secondary | ICD-10-CM | POA: Diagnosis not present

## 2018-01-03 DIAGNOSIS — I63422 Cerebral infarction due to embolism of left anterior cerebral artery: Secondary | ICD-10-CM | POA: Insufficient documentation

## 2018-01-03 DIAGNOSIS — Z8673 Personal history of transient ischemic attack (TIA), and cerebral infarction without residual deficits: Secondary | ICD-10-CM | POA: Diagnosis not present

## 2018-01-09 DIAGNOSIS — R519 Headache, unspecified: Secondary | ICD-10-CM | POA: Insufficient documentation

## 2018-01-15 ENCOUNTER — Encounter: Payer: Self-pay | Admitting: Family Medicine

## 2018-01-15 ENCOUNTER — Ambulatory Visit (INDEPENDENT_AMBULATORY_CARE_PROVIDER_SITE_OTHER): Payer: BLUE CROSS/BLUE SHIELD | Admitting: Family Medicine

## 2018-01-15 DIAGNOSIS — G43109 Migraine with aura, not intractable, without status migrainosus: Secondary | ICD-10-CM

## 2018-01-15 DIAGNOSIS — Z8673 Personal history of transient ischemic attack (TIA), and cerebral infarction without residual deficits: Secondary | ICD-10-CM

## 2018-01-15 NOTE — Assessment & Plan Note (Signed)
Still undergoing stroke work-up with cardiology we will continue Plavix and aspirin for now.

## 2018-01-15 NOTE — Assessment & Plan Note (Signed)
Migraines stable with Imitrex

## 2018-01-15 NOTE — Progress Notes (Signed)
BP 115/74 (BP Location: Left Arm, Patient Position: Sitting, Cuff Size: Normal)   Pulse 65   Temp 98 F (36.7 C) (Oral)   Ht 6' (1.829 m)   Wt 190 lb 11.2 oz (86.5 kg)   SpO2 99%   BMI 25.86 kg/m    Subjective:    Patient ID: Geoffrey Ramirez, male    DOB: 10/26/1960, 57 y.o.   MRN: [id number]  HPI: Geoffrey Ramirez is a 57 y.o. male  Chief Complaint  Patient presents with  . Annual Exam  Patient with a stormy fall starting with headaches and ending with diagnosis of stroke from MRI with neurology and cardiology evaluation.  Patient is currently on aspirin and Plavix through cardiology.  Is scheduled for transthoracic echocardiogram and is currently wearing a 24-hour Holter monitor. Patient's migraines are better continuing the use of Imitrex. Also taking atorvastatin 40 mg which was making him feel bad is cut back to 20 mg and feeling much better.   Relevant past medical, surgical, family and social history reviewed and updated as indicated. Interim medical history since our last visit reviewed. Allergies and medications reviewed and updated.  Review of Systems  Constitutional: Negative.   HENT: Negative.   Eyes: Negative.   Respiratory: Negative.   Cardiovascular: Negative.   Gastrointestinal: Negative.   Endocrine: Negative.   Genitourinary: Negative.   Musculoskeletal: Negative.   Skin: Negative.   Allergic/Immunologic: Negative.   Neurological: Negative.   Hematological: Negative.   Psychiatric/Behavioral: Negative.     Per HPI unless specifically indicated above     Objective:    BP 115/74 (BP Location: Left Arm, Patient Position: Sitting, Cuff Size: Normal)   Pulse 65   Temp 98 F (36.7 C) (Oral)   Ht 6' (1.829 m)   Wt 190 lb 11.2 oz (86.5 kg)   SpO2 99%   BMI 25.86 kg/m   Wt Readings from Last 3 Encounters:  01/15/18 190 lb 11.2 oz (86.5 kg)  12/31/17 187 lb (84.8 kg)  12/25/17 187 lb 4.8 oz (85 kg)    Physical Exam  Constitutional: He is  oriented to person, place, and time. He appears well-developed and well-nourished.  HENT:  Head: Normocephalic and atraumatic.  Right Ear: External ear normal.  Left Ear: External ear normal.  Eyes: Pupils are equal, round, and reactive to light. Conjunctivae and EOM are normal.  Neck: Normal range of motion. Neck supple.  Cardiovascular: Normal rate, regular rhythm, normal heart sounds and intact distal pulses.  Pulmonary/Chest: Effort normal and breath sounds normal.  Abdominal: Soft. Bowel sounds are normal. There is no splenomegaly or hepatomegaly.  Genitourinary: Rectum normal, prostate normal and penis normal.  Musculoskeletal: Normal range of motion.  Neurological: He is alert and oriented to person, place, and time. He has normal reflexes.  Skin: No rash noted. No erythema.  Psychiatric: He has a normal mood and affect. His behavior is normal. Judgment and thought content normal.    Results for orders placed or performed during the hospital encounter of 12/25/17  CBC with Differential  Result Value Ref Range   WBC 7.0 4.0 - 10.5 K/uL   RBC 5.59 4.22 - 5.81 MIL/uL   Hemoglobin 17.5 (H) 13.0 - 17.0 g/dL   HCT 51.5 39.0 - 52.0 %   MCV 92.1 80.0 - 100.0 fL   MCH 31.3 26.0 - 34.0 pg   MCHC 34.0 30.0 - 36.0 g/dL   RDW 13.0 11.5 - 15.5 %   Platelets 180  150 - 400 K/uL   nRBC 0.0 0.0 - 0.2 %   Neutrophils Relative % 72 %   Neutro Abs 4.9 1.7 - 7.7 K/uL   Lymphocytes Relative 22 %   Lymphs Abs 1.6 0.7 - 4.0 K/uL   Monocytes Relative 6 %   Monocytes Absolute 0.4 0.1 - 1.0 K/uL   Eosinophils Relative 0 %   Eosinophils Absolute 0.0 0.0 - 0.5 K/uL   Basophils Relative 0 %   Basophils Absolute 0.0 0.0 - 0.1 K/uL   Immature Granulocytes 0 %   Abs Immature Granulocytes 0.03 0.00 - 0.07 K/uL  Comprehensive metabolic panel  Result Value Ref Range   Sodium 139 135 - 145 mmol/L   Potassium 4.4 3.5 - 5.1 mmol/L   Chloride 104 98 - 111 mmol/L   CO2 26 22 - 32 mmol/L   Glucose, Bld  176 (H) 70 - 99 mg/dL   BUN 17 6 - 20 mg/dL   Creatinine, Ser 0.90 0.61 - 1.24 mg/dL   Calcium 9.2 8.9 - 10.3 mg/dL   Total Protein 7.5 6.5 - 8.1 g/dL   Albumin 4.2 3.5 - 5.0 g/dL   AST 22 15 - 41 U/L   ALT 20 0 - 44 U/L   Alkaline Phosphatase 56 38 - 126 U/L   Total Bilirubin 0.9 0.3 - 1.2 mg/dL   GFR calc non Af Amer >60 >60 mL/min   GFR calc Af Amer >60 >60 mL/min   Anion gap 9 5 - 15  Troponin I  Result Value Ref Range   Troponin I <0.03 <0.03 ng/mL  Urinalysis, Complete w Microscopic  Result Value Ref Range   Color, Urine STRAW (A) YELLOW   APPearance CLEAR (A) CLEAR   Specific Gravity, Urine 1.005 1.005 - 1.030   pH 5.0 5.0 - 8.0   Glucose, UA 50 (A) NEGATIVE mg/dL   Hgb urine dipstick NEGATIVE NEGATIVE   Bilirubin Urine NEGATIVE NEGATIVE   Ketones, ur NEGATIVE NEGATIVE mg/dL   Protein, ur NEGATIVE NEGATIVE mg/dL   Nitrite NEGATIVE NEGATIVE   Leukocytes, UA NEGATIVE NEGATIVE   RBC / HPF 0-5 0 - 5 RBC/hpf   WBC, UA 0-5 0 - 5 WBC/hpf   Bacteria, UA NONE SEEN NONE SEEN   Squamous Epithelial / LPF NONE SEEN 0 - 5   Mucus PRESENT   HIV antibody (Routine Testing)  Result Value Ref Range   HIV Screen 4th Generation wRfx Non Reactive Non Reactive  Hemoglobin A1c  Result Value Ref Range   Hgb A1c MFr Bld 5.3 4.8 - 5.6 %   Mean Plasma Glucose 105.41 mg/dL  Lipid panel  Result Value Ref Range   Cholesterol 168 0 - 200 mg/dL   Triglycerides 116 <150 mg/dL   HDL 40 (L) >40 mg/dL   Total CHOL/HDL Ratio 4.2 RATIO   VLDL 23 0 - 40 mg/dL   LDL Cholesterol 105 (H) 0 - 99 mg/dL  ECHOCARDIOGRAM COMPLETE  Result Value Ref Range   Weight 2,996.8 oz   Height 72 in   BP 130/90 mmHg      Assessment & Plan:   Problem List Items Addressed This Visit      Cardiovascular and Mediastinum   Migraine headache with aura    Migraines stable with Imitrex        Other   History of CVA (cerebrovascular accident) without residual deficits    Still undergoing stroke work-up with  cardiology we will continue Plavix and aspirin for now.  Reviewed lab work which was done both at work and in the hospital and is normal.  PSA is not present which will be sent over which was done and reported as normal verbally.  Follow up plan: Return in about 6 months (around 07/16/2018) for BMP,  Lipids, ALT, AST.

## 2018-01-22 ENCOUNTER — Encounter: Payer: Self-pay | Admitting: Anesthesiology

## 2018-01-22 ENCOUNTER — Ambulatory Visit
Admission: RE | Admit: 2018-01-22 | Discharge: 2018-01-22 | Disposition: A | Discharge status: Home / Self Care | Payer: BLUE CROSS/BLUE SHIELD | Source: Ambulatory Visit | Attending: Internal Medicine | Admitting: Internal Medicine

## 2018-01-22 ENCOUNTER — Encounter
Admission: RE | Disposition: A | Discharge status: Home / Self Care | Payer: Self-pay | Source: Ambulatory Visit | Attending: Internal Medicine

## 2018-01-22 ENCOUNTER — Other Ambulatory Visit: Payer: Self-pay

## 2018-01-22 DIAGNOSIS — I639 Cerebral infarction, unspecified: Secondary | ICD-10-CM | POA: Diagnosis not present

## 2018-01-22 DIAGNOSIS — Q211 Atrial septal defect: Secondary | ICD-10-CM | POA: Insufficient documentation

## 2018-01-22 DIAGNOSIS — E782 Mixed hyperlipidemia: Secondary | ICD-10-CM | POA: Diagnosis not present

## 2018-01-22 DIAGNOSIS — Z79899 Other long term (current) drug therapy: Secondary | ICD-10-CM | POA: Insufficient documentation

## 2018-01-22 DIAGNOSIS — I253 Aneurysm of heart: Secondary | ICD-10-CM | POA: Insufficient documentation

## 2018-01-22 DIAGNOSIS — Z8673 Personal history of transient ischemic attack (TIA), and cerebral infarction without residual deficits: Secondary | ICD-10-CM | POA: Insufficient documentation

## 2018-01-22 DIAGNOSIS — Z7982 Long term (current) use of aspirin: Secondary | ICD-10-CM | POA: Insufficient documentation

## 2018-01-22 DIAGNOSIS — Z791 Long term (current) use of non-steroidal anti-inflammatories (NSAID): Secondary | ICD-10-CM | POA: Insufficient documentation

## 2018-01-22 HISTORY — PX: TEE WITHOUT CARDIOVERSION: SHX5443

## 2018-01-22 SURGERY — ECHOCARDIOGRAM, TRANSESOPHAGEAL
Anesthesia: Moderate Sedation

## 2018-01-22 MED ORDER — FENTANYL CITRATE (PF) 100 MCG/2ML IJ SOLN
INTRAMUSCULAR | Status: AC
  Filled 2018-01-22: qty 2

## 2018-01-22 MED ORDER — MIDAZOLAM HCL 2 MG/2ML IJ SOLN
INTRAMUSCULAR | Status: AC | PRN
  Administered 2018-01-22: 2 mg via INTRAVENOUS
  Administered 2018-01-22: 1 mg via INTRAVENOUS

## 2018-01-22 MED ORDER — MIDAZOLAM HCL 5 MG/5ML IJ SOLN
INTRAMUSCULAR | Status: AC
  Filled 2018-01-22: qty 5

## 2018-01-22 MED ORDER — MIDAZOLAM HCL 5 MG/5ML IJ SOLN
INTRAMUSCULAR | Status: AC | PRN
  Administered 2018-01-22: 1 mg via INTRAVENOUS

## 2018-01-22 MED ORDER — BUTAMBEN-TETRACAINE-BENZOCAINE 2-2-14 % EX AERO
INHALATION_SPRAY | CUTANEOUS | Status: AC
  Administered 2018-01-22: 3
  Filled 2018-01-22: qty 5

## 2018-01-22 MED ORDER — FENTANYL CITRATE (PF) 100 MCG/2ML IJ SOLN
INTRAMUSCULAR | Status: AC | PRN
  Administered 2018-01-22 (×2): 25 ug via INTRAVENOUS
  Administered 2018-01-22: 50 ug via INTRAVENOUS

## 2018-01-22 MED ORDER — LIDOCAINE VISCOUS HCL 2 % MT SOLN
OROMUCOSAL | Status: AC
  Administered 2018-01-22: 15 mL
  Filled 2018-01-22: qty 15

## 2018-01-22 MED ORDER — SODIUM CHLORIDE 0.9 % IV SOLN
INTRAVENOUS | Status: DC
  Administered 2018-01-22: 1000 mL via INTRAVENOUS

## 2018-01-22 NOTE — Anesthesia Preprocedure Evaluation (Deleted)
Anesthesia Evaluation  Patient identified by MRN, date of birth, ID band Patient awake    Reviewed: Allergy & Precautions, H&P , NPO status , Patient's Chart, lab work & pertinent test results, reviewed documented beta blocker date and time   Airway Mallampati: II   Neck ROM: full    Dental  (+) Poor Dentition, Teeth Intact   Pulmonary neg pulmonary ROS,    Pulmonary exam normal        Cardiovascular negative cardio ROS Normal cardiovascular exam Rhythm:regular Rate:Normal     Neuro/Psych negative neurological ROS  negative psych ROS   GI/Hepatic negative GI ROS, Neg liver ROS,   Endo/Other  negative endocrine ROS  Renal/GU negative Renal ROS  negative genitourinary   Musculoskeletal   Abdominal   Peds  Hematology negative hematology ROS (+)   Anesthesia Other Findings Past Medical History: No date: Chronic prostatitis 12/25/2017: TIA (transient ischemic attack) Past Surgical History: No date: ANTERIOR CRUCIATE LIGAMENT REPAIR No date: CHOLECYSTECTOMY BMI    Body Mass Index:  25.23 kg/m     Reproductive/Obstetrics negative OB ROS                             Anesthesia Physical Anesthesia Plan  ASA: III  Anesthesia Plan: General   Post-op Pain Management:    Induction:   PONV Risk Score and Plan:   Airway Management Planned:   Additional Equipment:   Intra-op Plan:   Post-operative Plan:   Informed Consent: I have reviewed the patients History and Physical, chart, labs and discussed the procedure including the risks, benefits and alternatives for the proposed anesthesia with the patient or authorized representative who has indicated his/her understanding and acceptance.   Dental Advisory Given  Plan Discussed with: CRNA  Anesthesia Plan Comments:         Anesthesia Quick Evaluation

## 2018-01-22 NOTE — CV Procedure (Signed)
Transesophageal echocardiogram preliminary report  Geoffrey Ramirez [id number] May 10, 1960  Preliminary diagnosis  Stroke with possible embolic source  Postprocedural diagnosis  Stroke with possible embolic source including PFO and Atrial septal aneurysm  Time out A timeout was performed by the nursing staff and physicians specifically identifying the procedure performed, identification of the patient, the type of sedation, all allergies and medications, all pertinent medical history, and presedation assessment of nasopharynx. The patient and or family understand the risks of the procedure including the rare risks of death, stroke, heart attack, esophogeal perforation, sore throat, and reaction to medications given.  Moderate sedation During this procedure the patient has received Versed 5 milligrams and fentanyl 100 micrograms to achieve appropriate moderate sedation.  The patient had continued monitoring of heart rate, oxygenation, blood pressure, respiratory rate, and extent of signs of sedation throughout the entire procedure.  The patient received this moderate sedation over a period of 20 minutes.  Both the nursing staff and I were present during the procedure when the patient had moderate sedation for 100% of the time.  Treatment considerations  Antiplatelet therapy for patent foramen ovale with possible further tx options of closure device  For further details of transesophageal echocardiogram please refer to final report.  Signed,  Corey Skains M.D. [facility] 01/22/2018 8:48 AM

## 2018-01-22 NOTE — Progress Notes (Signed)
*  PRELIMINARY RESULTS* Echocardiogram Echocardiogram Transesophageal has been performed.  Sherrie Sport 01/22/2018, 8:26 AM

## 2018-01-23 ENCOUNTER — Encounter: Payer: Self-pay | Admitting: Internal Medicine

## 2018-01-31 ENCOUNTER — Inpatient Hospital Stay: Payer: BLUE CROSS/BLUE SHIELD | Admitting: Family Medicine

## 2018-02-03 DIAGNOSIS — I63422 Cerebral infarction due to embolism of left anterior cerebral artery: Secondary | ICD-10-CM | POA: Diagnosis not present

## 2018-02-14 DIAGNOSIS — E782 Mixed hyperlipidemia: Secondary | ICD-10-CM | POA: Diagnosis not present

## 2018-02-14 DIAGNOSIS — I63422 Cerebral infarction due to embolism of left anterior cerebral artery: Secondary | ICD-10-CM | POA: Diagnosis not present

## 2018-02-28 DIAGNOSIS — Q211 Atrial septal defect: Secondary | ICD-10-CM | POA: Diagnosis not present

## 2018-02-28 DIAGNOSIS — I253 Aneurysm of heart: Secondary | ICD-10-CM | POA: Insufficient documentation

## 2018-02-28 DIAGNOSIS — R51 Headache: Secondary | ICD-10-CM | POA: Diagnosis not present

## 2018-02-28 DIAGNOSIS — I63422 Cerebral infarction due to embolism of left anterior cerebral artery: Secondary | ICD-10-CM | POA: Diagnosis not present

## 2018-03-10 ENCOUNTER — Encounter

## 2018-03-10 ENCOUNTER — Ambulatory Visit: Payer: BLUE CROSS/BLUE SHIELD | Admitting: Internal Medicine

## 2018-03-26 DIAGNOSIS — Z7982 Long term (current) use of aspirin: Secondary | ICD-10-CM | POA: Diagnosis not present

## 2018-03-26 DIAGNOSIS — Q211 Atrial septal defect: Secondary | ICD-10-CM | POA: Diagnosis not present

## 2018-03-26 DIAGNOSIS — I253 Aneurysm of heart: Secondary | ICD-10-CM | POA: Diagnosis not present

## 2018-03-26 DIAGNOSIS — Z7902 Long term (current) use of antithrombotics/antiplatelets: Secondary | ICD-10-CM | POA: Diagnosis not present

## 2018-03-26 DIAGNOSIS — Z8673 Personal history of transient ischemic attack (TIA), and cerebral infarction without residual deficits: Secondary | ICD-10-CM | POA: Diagnosis not present

## 2018-03-26 DIAGNOSIS — Z79899 Other long term (current) drug therapy: Secondary | ICD-10-CM | POA: Diagnosis not present

## 2018-03-26 HISTORY — PX: PATENT FORAMEN OVALE(PFO) CLOSURE: CATH118300

## 2018-03-27 ENCOUNTER — Emergency Department
Admission: EM | Admit: 2018-03-27 | Discharge: 2018-03-28 | Disposition: A | Discharge status: Home / Self Care | Payer: BLUE CROSS/BLUE SHIELD | Attending: Emergency Medicine | Admitting: Emergency Medicine

## 2018-03-27 ENCOUNTER — Emergency Department: Payer: BLUE CROSS/BLUE SHIELD

## 2018-03-27 DIAGNOSIS — Z79899 Other long term (current) drug therapy: Secondary | ICD-10-CM | POA: Insufficient documentation

## 2018-03-27 DIAGNOSIS — Z7982 Long term (current) use of aspirin: Secondary | ICD-10-CM | POA: Diagnosis not present

## 2018-03-27 DIAGNOSIS — Z7902 Long term (current) use of antithrombotics/antiplatelets: Secondary | ICD-10-CM | POA: Insufficient documentation

## 2018-03-27 DIAGNOSIS — R509 Fever, unspecified: Secondary | ICD-10-CM | POA: Insufficient documentation

## 2018-03-27 DIAGNOSIS — Z8673 Personal history of transient ischemic attack (TIA), and cerebral infarction without residual deficits: Secondary | ICD-10-CM | POA: Diagnosis not present

## 2018-03-27 DIAGNOSIS — R918 Other nonspecific abnormal finding of lung field: Secondary | ICD-10-CM | POA: Diagnosis not present

## 2018-03-27 DIAGNOSIS — Q211 Atrial septal defect: Secondary | ICD-10-CM | POA: Diagnosis not present

## 2018-03-27 DIAGNOSIS — I253 Aneurysm of heart: Secondary | ICD-10-CM | POA: Diagnosis not present

## 2018-03-27 LAB — URINALYSIS, COMPLETE (UACMP) WITH MICROSCOPIC
BACTERIA UA: NONE SEEN
Bilirubin Urine: NEGATIVE
Glucose, UA: NEGATIVE mg/dL
HGB URINE DIPSTICK: NEGATIVE
Ketones, ur: 5 mg/dL — AB
Leukocytes, UA: NEGATIVE
NITRITE: NEGATIVE
PROTEIN: 30 mg/dL — AB
SPECIFIC GRAVITY, URINE: 1.034 — AB (ref 1.005–1.030)
pH: 5 (ref 5.0–8.0)

## 2018-03-27 LAB — COMPREHENSIVE METABOLIC PANEL
ALBUMIN: 4.2 g/dL (ref 3.5–5.0)
ALK PHOS: 61 U/L (ref 38–126)
ALT: 27 U/L (ref 0–44)
AST: 22 U/L (ref 15–41)
Anion gap: 6 (ref 5–15)
BUN: 20 mg/dL (ref 6–20)
CHLORIDE: 108 mmol/L (ref 98–111)
CO2: 23 mmol/L (ref 22–32)
CREATININE: 0.76 mg/dL (ref 0.61–1.24)
Calcium: 8.8 mg/dL — ABNORMAL LOW (ref 8.9–10.3)
GFR calc non Af Amer: >60 mL/min (ref >60)
GLUCOSE: 167 mg/dL — AB (ref 70–99)
Potassium: 3.5 mmol/L (ref 3.5–5.1)
SODIUM: 137 mmol/L (ref 135–145)
TOTAL PROTEIN: 7.3 g/dL (ref 6.5–8.1)
Total Bilirubin: 1.5 mg/dL — ABNORMAL HIGH (ref 0.3–1.2)

## 2018-03-27 LAB — CBC WITH DIFFERENTIAL/PLATELET
Abs Immature Granulocytes: 0.06 10*3/uL (ref 0.00–0.07)
BASOS ABS: 0 10*3/uL (ref 0.0–0.1)
Basophils Relative: 0 %
EOS ABS: 0 10*3/uL (ref 0.0–0.5)
Eosinophils Relative: 0 %
HEMATOCRIT: 46.6 % (ref 39.0–52.0)
Hemoglobin: 16.2 g/dL (ref 13.0–17.0)
Immature Granulocytes: 0 %
LYMPHS ABS: 1.9 10*3/uL (ref 0.7–4.0)
LYMPHS PCT: 11 %
MCH: 31.4 pg (ref 26.0–34.0)
MCHC: 34.8 g/dL (ref 30.0–36.0)
MCV: 90.3 fL (ref 80.0–100.0)
MONO ABS: 1.4 10*3/uL — AB (ref 0.1–1.0)
Monocytes Relative: 9 %
NEUTROS ABS: 13.1 10*3/uL — AB (ref 1.7–7.7)
NEUTROS PCT: 80 %
Platelets: 171 10*3/uL (ref 150–400)
RBC: 5.16 MIL/uL (ref 4.22–5.81)
RDW: 13.2 % (ref 11.5–15.5)
WBC: 16.5 10*3/uL — ABNORMAL HIGH (ref 4.0–10.5)
nRBC: 0 % (ref 0.0–0.2)

## 2018-03-27 LAB — INFLUENZA PANEL BY PCR (TYPE A & B)
INFLAPCR: NEGATIVE
Influenza B By PCR: NEGATIVE

## 2018-03-27 LAB — LACTIC ACID, PLASMA: LACTIC ACID, VENOUS: 1.2 mmol/L (ref 0.5–1.9)

## 2018-03-27 MED ORDER — SODIUM CHLORIDE 0.9% FLUSH: 3.0000 mL | Freq: Once | INTRAVENOUS | Status: DC

## 2018-03-27 NOTE — ED Triage Notes (Signed)
First Nurse Note:  Recent surgical procedure at [facility].  Just discharged and developed a fever today.  Referred to ED by [facility] to evaluate surgical site.  AAOx3.  Skin warm and dry.NAD

## 2018-03-27 NOTE — Discharge Instructions (Addendum)
As we discussed please call your cardiology team tomorrow to inform them of today's ER visit and to discuss follow-up.  Continue to take your temperature several times per day at home.  You may use Tylenol or ibuprofen for low-grade fevers, if you spike a high fever, or have any other concerning symptoms such as chest pain or shortness of breath please return to the emergency department for evaluation.

## 2018-03-27 NOTE — ED Provider Notes (Signed)
[facility] Emergency Department Provider Note  Time seen: 9:48 PM  I have reviewed the triage vital signs and the nursing notes.   HISTORY  Chief Complaint Post-op Problem    HPI Geoffrey Ramirez is a 58 y.o. male a past medical history of TIA, presents to the emergency department for a fever after a PFO closure yesterday.  According to the patient he had a PFO closure performed yesterday at [facility] intravascularly.  Was complicated by a hematoma formation at the insertion site of the right femoral artery, patient was discharged this morning.  Patient states he went home he has been feeling fatigued all day but states he thought that was from not sleeping while at the hospital.  States he felt warm and took his temperature and it was 101.4.  Took 1000 mg of Tylenol, was referred to the ER for evaluation.  Patient denies any cough, does have mild congestion today.  Denies any chest pain or abdominal pain.  Denies any dysuria denies any vomiting or diarrhea.   Past Medical History:  Diagnosis Date  . Chronic prostatitis   . TIA (transient ischemic attack) 12/25/2017    Patient Active Problem List   Diagnosis Date Noted  . History of CVA (cerebrovascular accident) without residual deficits 12/31/2017  . Plantar fasciitis of right foot 11/19/2016  . Migraine headache with aura 11/19/2016  . Chronic prostatitis 11/01/2014    Past Surgical History:  Procedure Laterality Date  . ANTERIOR CRUCIATE LIGAMENT REPAIR    . CHOLECYSTECTOMY    . TEE WITHOUT CARDIOVERSION N/A 01/22/2018   Procedure: TRANSESOPHAGEAL ECHOCARDIOGRAM (TEE);  Surgeon: Corey Skains, MD;  Location: [facility] ORS;  Service: Cardiovascular;  Laterality: N/A;    Prior to Admission medications   Medication Sig Start Date End Date Taking? Authorizing Provider  aspirin EC 81 MG tablet Take 81 mg by mouth daily.    [provider]  atorvastatin (LIPITOR) 40 MG tablet Take 1 tablet (40 mg  total) by mouth daily at 6 PM. Patient taking differently: Take 20 mg by mouth daily at 6 PM.  12/26/17   Loletha Grayer, MD  Cholecalciferol (VITAMIN D) 2000 units CAPS Take 2,000 Units by mouth daily.     [provider]  clopidogrel (PLAVIX) 75 MG tablet Take 1 tablet (75 mg total) by mouth daily. 12/31/17 12/31/18  Johnson, Megan P, DO  finasteride (PROSCAR) 5 MG tablet TAKE 1 TABLET BY MOUTH EVERY DAY Patient taking differently: Take 5 mg by mouth daily.  12/19/17   Guadalupe Maple, MD  loratadine (CLARITIN) 10 MG tablet Take 1 tablet (10 mg total) by mouth daily. Patient taking differently: Take 10 mg by mouth daily as needed for allergies.  12/31/17 12/31/18  Park Liter P, DO  Multiple Vitamin (MULTIVITAMIN WITH MINERALS) TABS tablet Take 1 tablet by mouth daily.    [provider]  Probiotic Product (ALIGN) 4 MG CAPS Take 4 mg by mouth daily.    [provider]  SUMAtriptan (IMITREX) 100 MG tablet Take 1 tablet (100 mg total) by mouth every 2 (two) hours as needed for migraine. Max 200mg  in 1 day 11/29/17   Park Liter P, DO    No Known Allergies  Family History  Problem Relation Age of Onset  . Cancer Mother   . Mental illness Mother   . Diabetes Father   . Cancer Maternal Grandmother   . Diabetes Maternal Grandfather   . Heart disease Maternal Grandfather   .  Stroke Maternal Grandfather   . Glaucoma Maternal Grandfather     Social History Social History   Tobacco Use  . Smoking status: Never Smoker  . Smokeless tobacco: Never Used  Substance Use Topics  . Alcohol use: Yes    Comment: rare  . Drug use: No    Review of Systems Constitutional: Positive for fever at home ENT: Mild congestion today Cardiovascular: Negative for chest pain. Respiratory: Negative for shortness of breath.  Negative for cough Gastrointestinal: Negative for abdominal pain, vomiting and diarrhea. Genitourinary: Negative for urinary  compaints Musculoskeletal: Negative for musculoskeletal complaints Skin: Negative for skin complaints  Neurological: Negative for headache All other ROS negative  ____________________________________________   PHYSICAL EXAM:  VITAL SIGNS: ED Triage Vitals  Enc Vitals Group     BP 03/27/18 1914 (!) 142/80     Pulse Rate 03/27/18 1914 80     Resp 03/27/18 1914 17     Temp 03/27/18 1914 98.7 F (37.1 C)     Temp Source 03/27/18 1914 Oral     SpO2 03/27/18 1914 100 %     Weight 03/27/18 1915 187 lb (84.8 kg)     Height 03/27/18 1915 6' (1.829 m)     Head Circumference --      Peak Flow --      Pain Score 03/27/18 1915 0     Pain Loc --      Pain Edu? --      Excl. in [place]? --    Constitutional: Alert and oriented. Well appearing and in no distress. Eyes: Normal exam ENT   Head: Normocephalic and atraumatic.   Mouth/Throat: Mucous membranes are moist. Cardiovascular: Normal rate, regular rhythm. No murmur Respiratory: Normal respiratory effort without tachypnea nor retractions. Breath sounds are clear  Gastrointestinal: Soft and nontender. No distention.   Musculoskeletal: Patient has moderate ecchymosis with mild hematoma around the right femoral insertion site.  Minimal tenderness around the site.  No obvious erythema or signs of cellulitis. Neurologic:  Normal speech and language. No gross focal neurologic deficits  Skin:  Skin is warm, dry, but with ecchymosis/hematoma in the right femoral area as described above. Psychiatric: Mood and affect are normal.   ____________________________________________    RADIOLOGY  Chest x-ray is negative  ____________________________________________   INITIAL IMPRESSION / ASSESSMENT AND PLAN / ED COURSE  Pertinent labs & imaging results that were available during my care of the patient were reviewed by me and considered in my medical decision making (see chart for details).  Patient presents to the emergency department for  fever, 1 day status post PFO closure at [facility].  Overall patient appears well, afebrile in the emergency department but states he took 1000 mg of Tylenol before arrival.  No murmur.  Patient does have mild hematoma with ecchymosis to the right femoral insertion site but no signs of cellulitis.  Minimal tenderness to this area.  Differential would include postsurgical infection, inflammatory response, viral infection.  So far the patient's work-up is largely reassuring including normal lactate, mild leukocytosis, normal urinalysis and a normal chest x-ray.  We will discuss with [place] cardiology for further recommendations.  Influenza swab pending.  I was able to discuss the patient with the [place] CCU fellow.  Overall the patient appears very well with a largely negative work-up we believe the patient is safe for discharge home.  Patient's white blood cell count is 16,000, cardiology says it was 15,000 upon discharge, H&H is unchanged.  Patient remains  afebrile in the emergency department continues to appear well.  Influenza is negative.  The remainder of the patient's lab work is normal including a lactic acid of 1.2.  [place] cardiology would like [place] to check blood cultures to at least have those pending as a precaution.  I believe this is reasonable and the patient is agreeable to this plan of care.  We will check blood cultures we will discharge home with supportive care, I discussed return precautions to the emergency department as well as following up with his cardiology team tomorrow.  Patient agreeable to plan of care.  ____________________________________________   FINAL CLINICAL IMPRESSION(S) / ED DIAGNOSES  Fever    Harvest Dark, MD 03/27/18 2310

## 2018-03-27 NOTE — ED Triage Notes (Signed)
Patient just released from [facility] today post PFO closure surgery. Patient reports fever at home of 101.4. patient took 1000 mg tylenol at 1700 today.

## 2018-03-27 NOTE — ED Notes (Signed)
Pt was discharged after PFO procedure on 1/15 and discharged this am from [facility]. Fever this afternoon. Spoke to NP for cardiology and she recommended coming to ED

## 2018-03-28 NOTE — ED Notes (Signed)
Mischarted for 1130  should be for 2330

## 2018-03-28 NOTE — ED Notes (Signed)
Pt verbalized understanding of d/c instructions and f/u care, no further questions. Pt ambulatory to the exit with steady gait.

## 2018-04-01 LAB — CULTURE, BLOOD (ROUTINE X 2)
CULTURE: NO GROWTH
Culture: NO GROWTH
SPECIAL REQUESTS: ADEQUATE
Special Requests: ADEQUATE

## 2018-04-03 DIAGNOSIS — I63422 Cerebral infarction due to embolism of left anterior cerebral artery: Secondary | ICD-10-CM | POA: Diagnosis not present

## 2018-04-03 DIAGNOSIS — Q211 Atrial septal defect: Secondary | ICD-10-CM | POA: Diagnosis not present

## 2018-04-08 ENCOUNTER — Telehealth: Payer: Self-pay | Admitting: Family Medicine

## 2018-04-08 NOTE — Telephone Encounter (Signed)
Requested medication (s) are due for refill today: no  Requested medication (s) are on the active medication list: no  Last refill:  08/31/17  Future visit scheduled: yes  Notes to clinic:  D/C'd 01/05/18 per Dr Loletha Grayer    Requested Prescriptions  Pending Prescriptions Disp Refills   vardenafil (LEVITRA) 20 MG tablet [Pharmacy Med Name: VARDENAFIL HCL 20 MG TABLET] 6 tablet 12    Sig: TAKE 1 TABLET BY MOUTH EVERY DAY AS NEEDED     Urology: Erectile Dysfunction Agents Passed - 04/08/2018 12:43 PM      Passed - Last BP in normal range    BP Readings from Last 1 Encounters:  03/27/18 126/78         Passed - Valid encounter within last 12 months    Recent Outpatient Visits          2 months ago Migraine with aura and without status migrainosus, not intractable   [facility] Crissman, Jeannette How, MD   3 months ago History of CVA (cerebrovascular accident) without residual deficits   [employer], Megan P, DO   4 months ago Migraine with aura and without status migrainosus, not intractable   [place], Megan P, DO   1 year ago Annual physical exam   [facility] Crissman, Jeannette How, MD   1 year ago Chronic allergic rhinitis, unspecified seasonality, unspecified trigger   [facility] Volney American, PA-C      Future Appointments            In 4 months Crissman, Jeannette How, MD [facility]

## 2018-04-09 ENCOUNTER — Other Ambulatory Visit: Payer: Self-pay | Admitting: Family Medicine

## 2018-04-25 NOTE — Telephone Encounter (Signed)
Pt is calling to state this medication Vardenafil (LEVITRA) 20 MG is not at the [facility]. Please advise he would like a call back with a update on the status of this RX.

## 2018-04-25 NOTE — Telephone Encounter (Signed)
Called and spoke with pharmacy, they did not get the script, medication called in.

## 2018-05-08 DIAGNOSIS — I63422 Cerebral infarction due to embolism of left anterior cerebral artery: Secondary | ICD-10-CM | POA: Diagnosis not present

## 2018-05-08 DIAGNOSIS — Z95818 Presence of other cardiac implants and grafts: Secondary | ICD-10-CM | POA: Diagnosis not present

## 2018-05-08 DIAGNOSIS — Z86718 Personal history of other venous thrombosis and embolism: Secondary | ICD-10-CM | POA: Diagnosis not present

## 2018-05-08 DIAGNOSIS — Z48812 Encounter for surgical aftercare following surgery on the circulatory system: Secondary | ICD-10-CM | POA: Diagnosis not present

## 2018-05-08 DIAGNOSIS — Z7901 Long term (current) use of anticoagulants: Secondary | ICD-10-CM | POA: Diagnosis not present

## 2018-05-08 DIAGNOSIS — Z7902 Long term (current) use of antithrombotics/antiplatelets: Secondary | ICD-10-CM | POA: Diagnosis not present

## 2018-05-08 DIAGNOSIS — I517 Cardiomegaly: Secondary | ICD-10-CM | POA: Diagnosis not present

## 2018-05-08 DIAGNOSIS — Q211 Atrial septal defect: Secondary | ICD-10-CM | POA: Diagnosis not present

## 2018-05-08 DIAGNOSIS — Z8774 Personal history of (corrected) congenital malformations of heart and circulatory system: Secondary | ICD-10-CM | POA: Diagnosis not present

## 2018-05-08 DIAGNOSIS — Z8673 Personal history of transient ischemic attack (TIA), and cerebral infarction without residual deficits: Secondary | ICD-10-CM | POA: Diagnosis not present

## 2018-06-19 DIAGNOSIS — E782 Mixed hyperlipidemia: Secondary | ICD-10-CM | POA: Diagnosis not present

## 2018-06-19 DIAGNOSIS — I10 Essential (primary) hypertension: Secondary | ICD-10-CM | POA: Insufficient documentation

## 2018-06-19 DIAGNOSIS — I63422 Cerebral infarction due to embolism of left anterior cerebral artery: Secondary | ICD-10-CM | POA: Diagnosis not present

## 2018-06-19 DIAGNOSIS — Q211 Atrial septal defect: Secondary | ICD-10-CM | POA: Diagnosis not present

## 2018-08-04 ENCOUNTER — Encounter: Payer: Self-pay | Admitting: Family Medicine

## 2018-08-05 ENCOUNTER — Encounter: Payer: Self-pay | Admitting: Family Medicine

## 2018-08-06 ENCOUNTER — Encounter: Payer: Self-pay | Admitting: Family Medicine

## 2018-08-06 ENCOUNTER — Ambulatory Visit (INDEPENDENT_AMBULATORY_CARE_PROVIDER_SITE_OTHER): Payer: BLUE CROSS/BLUE SHIELD | Admitting: Family Medicine

## 2018-08-06 ENCOUNTER — Other Ambulatory Visit: Payer: Self-pay

## 2018-08-06 DIAGNOSIS — E7800 Pure hypercholesterolemia, unspecified: Secondary | ICD-10-CM | POA: Insufficient documentation

## 2018-08-06 DIAGNOSIS — Z8673 Personal history of transient ischemic attack (TIA), and cerebral infarction without residual deficits: Secondary | ICD-10-CM

## 2018-08-06 DIAGNOSIS — G43109 Migraine with aura, not intractable, without status migrainosus: Secondary | ICD-10-CM

## 2018-08-06 NOTE — Progress Notes (Signed)
BP 130/65    Subjective:    Patient ID: Geoffrey Ramirez, male    DOB: 06-Feb-1961, 58 y.o.   MRN: [id number]  HPI: Geoffrey Ramirez is a 58 y.o. male  Med check   Telemedicine using audio/video telecommunications for a synchronous communication visit. Today's visit due to COVID-19 isolation precautions I connected with and verified that I am speaking with the correct person using two identifiers.   I discussed the limitations, risks, security and privacy concerns of performing an evaluation and management service by telecommunication and the availability of in person appointments. I also discussed with the patient that there may be a patient responsible charge related to this service. The patient expressed understanding and agreed to proceed. The patient's location is work. I am at home.  Discussed with patient ongoing cholesterol issues reviewed from cardiology notes requesting intensive cholesterol management maintain LDL results below 70.  Patient taking atorvastatin 40 without problems. Has had cardiac procedure with good results and no further medical problems from closure of shunt. Has neurology appointment coming up initially procedure helped with migraines but now he is back having his migraines again. We will have blood work done at [facility].   Relevant past medical, surgical, family and social history reviewed and updated as indicated. Interim medical history since our last visit reviewed. Allergies and medications reviewed and updated.  Review of Systems  Constitutional: Negative.   Respiratory: Negative.   Cardiovascular: Negative.     Per HPI unless specifically indicated above     Objective:    BP 130/65   Wt Readings from Last 3 Encounters:  03/27/18 187 lb (84.8 kg)  01/22/18 186 lb (84.4 kg)  01/15/18 190 lb 11.2 oz (86.5 kg)    Physical Exam  Results for orders placed or performed during the hospital encounter of 03/27/18  Blood culture (routine x 2)   Result Value Ref Range   Specimen Description BLOOD RIGHT ASSIST CONTROL    Special Requests      BOTTLES DRAWN AEROBIC AND ANAEROBIC Blood Culture adequate volume   Culture      NO GROWTH 5 DAYS Performed at [facility], [address] [place], [place] [address]    Report Status 04/01/2018 FINAL   Blood culture (routine x 2)  Result Value Ref Range   Specimen Description BLOOD LEFT WRIST    Special Requests      BOTTLES DRAWN AEROBIC AND ANAEROBIC Blood Culture adequate volume   Culture      NO GROWTH 5 DAYS Performed at [facility], [address] [place], [place] [address]    Report Status 04/01/2018 FINAL   Lactic acid, plasma  Result Value Ref Range   Lactic Acid, Venous 1.2 0.5 - 1.9 mmol/L  Comprehensive metabolic panel  Result Value Ref Range   Sodium 137 135 - 145 mmol/L   Potassium 3.5 3.5 - 5.1 mmol/L   Chloride 108 98 - 111 mmol/L   CO2 23 22 - 32 mmol/L   Glucose, Bld 167 (H) 70 - 99 mg/dL   BUN 20 6 - 20 mg/dL   Creatinine, Ser 0.76 0.61 - 1.24 mg/dL   Calcium 8.8 (L) 8.9 - 10.3 mg/dL   Total Protein 7.3 6.5 - 8.1 g/dL   Albumin 4.2 3.5 - 5.0 g/dL   AST 22 15 - 41 U/L   ALT 27 0 - 44 U/L   Alkaline Phosphatase 61 38 - 126 U/L   Total Bilirubin 1.5 (H)  0.3 - 1.2 mg/dL   GFR calc non Af Amer >60 >60 mL/min   GFR calc Af Amer >60 >60 mL/min   Anion gap 6 5 - 15  CBC with Differential  Result Value Ref Range   WBC 16.5 (H) 4.0 - 10.5 K/uL   RBC 5.16 4.22 - 5.81 MIL/uL   Hemoglobin 16.2 13.0 - 17.0 g/dL   HCT 46.6 39.0 - 52.0 %   MCV 90.3 80.0 - 100.0 fL   MCH 31.4 26.0 - 34.0 pg   MCHC 34.8 30.0 - 36.0 g/dL   RDW 13.2 11.5 - 15.5 %   Platelets 171 150 - 400 K/uL   nRBC 0.0 0.0 - 0.2 %   Neutrophils Relative % 80 %   Neutro Abs 13.1 (H) 1.7 - 7.7 K/uL   Lymphocytes Relative 11 %   Lymphs Abs 1.9 0.7 - 4.0 K/uL   Monocytes Relative 9 %   Monocytes Absolute 1.4 (H) 0.1 - 1.0 K/uL   Eosinophils Relative 0 %   Eosinophils  Absolute 0.0 0.0 - 0.5 K/uL   Basophils Relative 0 %   Basophils Absolute 0.0 0.0 - 0.1 K/uL   Immature Granulocytes 0 %   Abs Immature Granulocytes 0.06 0.00 - 0.07 K/uL  Urinalysis, Complete w Microscopic  Result Value Ref Range   Color, Urine AMBER (A) YELLOW   APPearance CLEAR (A) CLEAR   Specific Gravity, Urine 1.034 (H) 1.005 - 1.030   pH 5.0 5.0 - 8.0   Glucose, UA NEGATIVE NEGATIVE mg/dL   Hgb urine dipstick NEGATIVE NEGATIVE   Bilirubin Urine NEGATIVE NEGATIVE   Ketones, ur 5 (A) NEGATIVE mg/dL   Protein, ur 30 (A) NEGATIVE mg/dL   Nitrite NEGATIVE NEGATIVE   Leukocytes, UA NEGATIVE NEGATIVE   RBC / HPF 6-10 0 - 5 RBC/hpf   WBC, UA 0-5 0 - 5 WBC/hpf   Bacteria, UA NONE SEEN NONE SEEN   Squamous Epithelial / LPF 0-5 0 - 5   Mucus PRESENT   Influenza panel by PCR (type A & B)  Result Value Ref Range   Influenza A By PCR NEGATIVE NEGATIVE   Influenza B By PCR NEGATIVE NEGATIVE      Assessment & Plan:   Problem List Items Addressed This Visit      Cardiovascular and Mediastinum   Migraine headache with aura    The current medical regimen is effective;  continue present plan and medications.         Other   History of CVA (cerebrovascular accident) without residual deficits    The current medical regimen is effective;  continue present plan and medications.       Hypercholesteremia    Labs pending         I discussed the assessment and treatment plan with the patient. The patient was provided an opportunity to ask questions and all were answered. The patient agreed with the plan and demonstrated an understanding of the instructions.   The patient was advised to call back or seek an in-person evaluation if the symptoms worsen or if the condition fails to improve as anticipated.   I provided 21+ minutes of time during this encounter.  Follow up plan: Return in about 6 months (around 02/06/2019) for Physical Exam.

## 2018-08-06 NOTE — Assessment & Plan Note (Signed)
The current medical regimen is effective;  continue present plan and medications.  

## 2018-08-06 NOTE — Assessment & Plan Note (Signed)
Labs pending.  

## 2018-08-08 ENCOUNTER — Encounter: Payer: Self-pay | Admitting: Family Medicine

## 2018-09-10 DIAGNOSIS — D2261 Melanocytic nevi of right upper limb, including shoulder: Secondary | ICD-10-CM | POA: Diagnosis not present

## 2018-09-10 DIAGNOSIS — D2262 Melanocytic nevi of left upper limb, including shoulder: Secondary | ICD-10-CM | POA: Diagnosis not present

## 2018-09-10 DIAGNOSIS — D225 Melanocytic nevi of trunk: Secondary | ICD-10-CM | POA: Diagnosis not present

## 2018-09-10 DIAGNOSIS — L218 Other seborrheic dermatitis: Secondary | ICD-10-CM | POA: Diagnosis not present

## 2018-10-02 DIAGNOSIS — R51 Headache: Secondary | ICD-10-CM | POA: Diagnosis not present

## 2018-10-02 DIAGNOSIS — Z8673 Personal history of transient ischemic attack (TIA), and cerebral infarction without residual deficits: Secondary | ICD-10-CM | POA: Diagnosis not present

## 2018-10-30 DIAGNOSIS — Z8673 Personal history of transient ischemic attack (TIA), and cerebral infarction without residual deficits: Secondary | ICD-10-CM | POA: Diagnosis not present

## 2018-10-30 DIAGNOSIS — E782 Mixed hyperlipidemia: Secondary | ICD-10-CM | POA: Diagnosis not present

## 2018-10-30 DIAGNOSIS — Q211 Atrial septal defect: Secondary | ICD-10-CM | POA: Diagnosis not present

## 2018-10-30 DIAGNOSIS — Z8774 Personal history of (corrected) congenital malformations of heart and circulatory system: Secondary | ICD-10-CM | POA: Diagnosis not present

## 2018-10-30 DIAGNOSIS — I371 Nonrheumatic pulmonary valve insufficiency: Secondary | ICD-10-CM | POA: Diagnosis not present

## 2018-10-30 DIAGNOSIS — Z48812 Encounter for surgical aftercare following surgery on the circulatory system: Secondary | ICD-10-CM | POA: Diagnosis not present

## 2018-10-30 DIAGNOSIS — I10 Essential (primary) hypertension: Secondary | ICD-10-CM | POA: Diagnosis not present

## 2018-10-30 DIAGNOSIS — I63422 Cerebral infarction due to embolism of left anterior cerebral artery: Secondary | ICD-10-CM | POA: Diagnosis not present

## 2018-10-30 DIAGNOSIS — Z79899 Other long term (current) drug therapy: Secondary | ICD-10-CM | POA: Diagnosis not present

## 2018-12-25 DIAGNOSIS — Q211 Atrial septal defect: Secondary | ICD-10-CM | POA: Diagnosis not present

## 2018-12-25 DIAGNOSIS — I63422 Cerebral infarction due to embolism of left anterior cerebral artery: Secondary | ICD-10-CM | POA: Diagnosis not present

## 2018-12-25 DIAGNOSIS — E782 Mixed hyperlipidemia: Secondary | ICD-10-CM | POA: Diagnosis not present

## 2018-12-25 DIAGNOSIS — I10 Essential (primary) hypertension: Secondary | ICD-10-CM | POA: Diagnosis not present

## 2019-01-30 DIAGNOSIS — Q211 Atrial septal defect: Secondary | ICD-10-CM | POA: Diagnosis not present

## 2019-01-30 DIAGNOSIS — Z0000 Encounter for general adult medical examination without abnormal findings: Secondary | ICD-10-CM | POA: Diagnosis not present

## 2019-01-30 DIAGNOSIS — I10 Essential (primary) hypertension: Secondary | ICD-10-CM | POA: Diagnosis not present

## 2019-01-30 DIAGNOSIS — I63422 Cerebral infarction due to embolism of left anterior cerebral artery: Secondary | ICD-10-CM | POA: Diagnosis not present

## 2019-01-30 DIAGNOSIS — R7303 Prediabetes: Secondary | ICD-10-CM | POA: Insufficient documentation

## 2019-02-04 DIAGNOSIS — M79672 Pain in left foot: Secondary | ICD-10-CM | POA: Diagnosis not present

## 2019-02-20 ENCOUNTER — Telehealth: Payer: Self-pay | Admitting: Gastroenterology

## 2019-02-20 NOTE — Telephone Encounter (Signed)
Pt left vm he spoke with Dr. Doy Hutching and likes to go ahead and set up for this referral please call pt

## 2019-02-24 ENCOUNTER — Telehealth: Payer: Self-pay

## 2019-02-24 ENCOUNTER — Other Ambulatory Visit: Payer: Self-pay

## 2019-02-24 DIAGNOSIS — Z1211 Encounter for screening for malignant neoplasm of colon: Secondary | ICD-10-CM

## 2019-02-24 NOTE — Telephone Encounter (Signed)
Gastroenterology Pre-Procedure Review  Request Date: 04/24/19 Requesting Physician: Dr. Allen Norris  PATIENT REVIEW QUESTIONS: The patient responded to the following health history questions as indicated:    1. Are you having any GI issues? no 2. Do you have a personal history of Polyps? yes (not on the last one but maybe the one before) 3. Do you have a family history of Colon Cancer or Polyps? no 4. Diabetes Mellitus? no 5. Joint replacements in the past 12 months?no 6. Major health problems in the past 3 months?PFO Closure at [facility] January 15th,2019 7. Any artificial heart valves, MVP, or defibrillator?no    MEDICATIONS & ALLERGIES:    Patient reports the following regarding taking any anticoagulation/antiplatelet therapy:   Plavix, Coumadin, Eliquis, Xarelto, Lovenox, Pradaxa, Brilinta, or Effient? no Aspirin? yes (81 mg daily)  Patient confirms/reports the following medications:  Current Outpatient Medications  Medication Sig Dispense Refill  . aspirin EC 81 MG tablet Take 81 mg by mouth daily.    Marland Kitchen atorvastatin (LIPITOR) 40 MG tablet Take 1 tablet (40 mg total) by mouth daily at 6 PM. (Patient taking differently: Take 20 mg by mouth daily at 6 PM. ) 30 tablet 0  . Cholecalciferol (VITAMIN D) 2000 units CAPS Take 2,000 Units by mouth daily.     . finasteride (PROSCAR) 5 MG tablet TAKE 1 TABLET BY MOUTH EVERY DAY (Patient taking differently: Take 5 mg by mouth daily. ) 90 tablet 4  . loratadine (CLARITIN) 10 MG tablet Take 1 tablet (10 mg total) by mouth daily. (Patient taking differently: Take 10 mg by mouth daily as needed for allergies. ) 90 tablet 3  . Multiple Vitamin (MULTIVITAMIN WITH MINERALS) TABS tablet Take 1 tablet by mouth daily.    . Probiotic Product (ALIGN) 4 MG CAPS Take 4 mg by mouth daily.    . SUMAtriptan (IMITREX) 100 MG tablet Take 1 tablet (100 mg total) by mouth every 2 (two) hours as needed for migraine. Max 200mg  in 1 day 6 tablet 12  . vardenafil (LEVITRA) 20 MG  tablet TAKE 1 TABLET BY MOUTH EVERY DAY AS NEEDED 6 tablet 12   No current facility-administered medications for this visit.    Patient confirms/reports the following allergies:  No Known Allergies  No orders of the defined types were placed in this encounter.   AUTHORIZATION INFORMATION Primary Insurance: 1D#: Group #:  Secondary Insurance: 1D#: Group #:  SCHEDULE INFORMATION: Date: 04/22/19 Time: Location:[facility]

## 2019-02-24 NOTE — Telephone Encounter (Signed)
Colonoscopy scheduled for 04/24/19 with Dr. Allen Norris.

## 2019-03-24 ENCOUNTER — Telehealth: Payer: Self-pay

## 2019-03-24 ENCOUNTER — Other Ambulatory Visit: Payer: Self-pay

## 2019-03-24 NOTE — Telephone Encounter (Signed)
Called patient and patient would like to rescheduled his procedure on 04/24/2019 to Dr. Allen Norris . Informed patient at this time I do not know the [place] scheduled and I would call him when that is available and I would put him on my call back list

## 2019-03-25 ENCOUNTER — Other Ambulatory Visit: Payer: Self-pay

## 2019-03-25 ENCOUNTER — Telehealth: Payer: Self-pay

## 2019-03-25 DIAGNOSIS — Z1211 Encounter for screening for malignant neoplasm of colon: Secondary | ICD-10-CM

## 2019-03-25 MED ORDER — NA SULFATE-K SULFATE-MG SULF 17.5-3.13-1.6 GM/177ML PO SOLN: 354.0000 mL | Freq: Once | ORAL | 0 refills | Status: AC

## 2019-03-25 NOTE — Telephone Encounter (Signed)
Patient states he can do procedure on 04/14/2019 went over instructions with patient and sent them to mychart. Sent prep to pharmacy

## 2019-04-07 ENCOUNTER — Telehealth: Payer: Self-pay | Admitting: Gastroenterology

## 2019-04-07 NOTE — Telephone Encounter (Signed)
Patient has been advised after he has his COVID test he may go back to work, however he does need to social distance, wash hands frequently, wear mask.  Thanks [place], [place]

## 2019-04-07 NOTE — Telephone Encounter (Signed)
Pt left vm he is scheduled for a procedure on 04/14/19 and has a question about being quarantined please cal pt

## 2019-04-08 DIAGNOSIS — Z8673 Personal history of transient ischemic attack (TIA), and cerebral infarction without residual deficits: Secondary | ICD-10-CM | POA: Diagnosis not present

## 2019-04-08 DIAGNOSIS — R519 Headache, unspecified: Secondary | ICD-10-CM | POA: Diagnosis not present

## 2019-04-09 ENCOUNTER — Other Ambulatory Visit: Payer: Self-pay

## 2019-04-09 ENCOUNTER — Encounter: Payer: Self-pay | Admitting: Gastroenterology

## 2019-04-10 ENCOUNTER — Other Ambulatory Visit
Admission: RE | Admit: 2019-04-10 | Discharge: 2019-04-10 | Disposition: A | Discharge status: Home / Self Care | Payer: BC Managed Care – PPO | Source: Ambulatory Visit | Attending: Gastroenterology | Admitting: Gastroenterology

## 2019-04-10 DIAGNOSIS — Z01812 Encounter for preprocedural laboratory examination: Secondary | ICD-10-CM | POA: Diagnosis not present

## 2019-04-10 DIAGNOSIS — Z20822 Contact with and (suspected) exposure to covid-19: Secondary | ICD-10-CM | POA: Insufficient documentation

## 2019-04-10 LAB — SARS CORONAVIRUS 2 (TAT 6-24 HRS): SARS Coronavirus 2: NEGATIVE

## 2019-04-13 NOTE — Discharge Instructions (Signed)
General Anesthesia, Adult, Care After This sheet gives you information about how to care for yourself after your procedure. Your health care provider may also give you more specific instructions. If you have problems or questions, contact your health care provider. What can I expect after the procedure? After the procedure, the following side effects are common:  Pain or discomfort at the IV site.  Nausea.  Vomiting.  Sore throat.  Trouble concentrating.  Feeling cold or chills.  Weak or tired.  Sleepiness and fatigue.  Soreness and body aches. These side effects can affect parts of the body that were not involved in surgery. Follow these instructions at home:  For at least 24 hours after the procedure:  Have a responsible adult stay with you. It is important to have someone help care for you until you are awake and alert.  Rest as needed.  Do not: ? Participate in activities in which you could fall or become injured. ? Drive. ? Use heavy machinery. ? Drink alcohol. ? Take sleeping pills or medicines that cause drowsiness. ? Make important decisions or sign legal documents. ? Take care of children on your own. Eating and drinking  Follow any instructions from your health care provider about eating or drinking restrictions.  When you feel hungry, start by eating small amounts of foods that are soft and easy to digest (bland), such as toast. Gradually return to your regular diet.  Drink enough fluid to keep your urine pale yellow.  If you vomit, rehydrate by drinking water, juice, or clear broth. General instructions  If you have sleep apnea, surgery and certain medicines can increase your risk for breathing problems. Follow instructions from your health care provider about wearing your sleep device: ? Anytime you are sleeping, including during daytime naps. ? While taking prescription pain medicines, sleeping medicines, or medicines that make you drowsy.  Return to  your normal activities as told by your health care provider. Ask your health care provider what activities are safe for you.  Take over-the-counter and prescription medicines only as told by your health care provider.  If you smoke, do not smoke without supervision.  Keep all follow-up visits as told by your health care provider. This is important. Contact a health care provider if:  You have nausea or vomiting that does not get better with medicine.  You cannot eat or drink without vomiting.  You have pain that does not get better with medicine.  You are unable to pass urine.  You develop a skin rash.  You have a fever.  You have redness around your IV site that gets worse. Get help right away if:  You have difficulty breathing.  You have chest pain.  You have blood in your urine or stool, or you vomit blood. Summary  After the procedure, it is common to have a sore throat or nausea. It is also common to feel tired.  Have a responsible adult stay with you for the first 24 hours after general anesthesia. It is important to have someone help care for you until you are awake and alert.  When you feel hungry, start by eating small amounts of foods that are soft and easy to digest (bland), such as toast. Gradually return to your regular diet.  Drink enough fluid to keep your urine pale yellow.  Return to your normal activities as told by your health care provider. Ask your health care provider what activities are safe for you. This information is not   intended to replace advice given to you by your health care provider. Make sure you discuss any questions you have with your health care provider. Document Revised: 03/01/2017 Document Reviewed: 10/12/2016 Elsevier Patient Education  2020 [employer]  

## 2019-04-14 ENCOUNTER — Ambulatory Visit
Admission: RE | Admit: 2019-04-14 | Discharge: 2019-04-14 | Disposition: A | Discharge status: Home / Self Care | Payer: BC Managed Care – PPO | Attending: Gastroenterology | Admitting: Gastroenterology

## 2019-04-14 ENCOUNTER — Encounter
Admission: RE | Disposition: A | Discharge status: Home / Self Care | Payer: Self-pay | Source: Home / Self Care | Attending: Gastroenterology

## 2019-04-14 ENCOUNTER — Ambulatory Visit: Payer: BC Managed Care – PPO | Admitting: Anesthesiology

## 2019-04-14 ENCOUNTER — Encounter: Payer: Self-pay | Admitting: Gastroenterology

## 2019-04-14 ENCOUNTER — Other Ambulatory Visit: Payer: Self-pay

## 2019-04-14 DIAGNOSIS — D128 Benign neoplasm of rectum: Secondary | ICD-10-CM | POA: Insufficient documentation

## 2019-04-14 DIAGNOSIS — Z860100 Personal history of colon polyps, unspecified: Secondary | ICD-10-CM

## 2019-04-14 DIAGNOSIS — D126 Benign neoplasm of colon, unspecified: Secondary | ICD-10-CM | POA: Diagnosis not present

## 2019-04-14 DIAGNOSIS — K635 Polyp of colon: Secondary | ICD-10-CM | POA: Diagnosis not present

## 2019-04-14 DIAGNOSIS — K640 First degree hemorrhoids: Secondary | ICD-10-CM | POA: Diagnosis not present

## 2019-04-14 DIAGNOSIS — N411 Chronic prostatitis: Secondary | ICD-10-CM | POA: Insufficient documentation

## 2019-04-14 DIAGNOSIS — Z79899 Other long term (current) drug therapy: Secondary | ICD-10-CM | POA: Diagnosis not present

## 2019-04-14 DIAGNOSIS — Z7982 Long term (current) use of aspirin: Secondary | ICD-10-CM | POA: Diagnosis not present

## 2019-04-14 DIAGNOSIS — Z8673 Personal history of transient ischemic attack (TIA), and cerebral infarction without residual deficits: Secondary | ICD-10-CM | POA: Diagnosis not present

## 2019-04-14 DIAGNOSIS — Z1211 Encounter for screening for malignant neoplasm of colon: Secondary | ICD-10-CM | POA: Diagnosis not present

## 2019-04-14 DIAGNOSIS — G43909 Migraine, unspecified, not intractable, without status migrainosus: Secondary | ICD-10-CM | POA: Insufficient documentation

## 2019-04-14 DIAGNOSIS — D123 Benign neoplasm of transverse colon: Secondary | ICD-10-CM | POA: Diagnosis not present

## 2019-04-14 DIAGNOSIS — K621 Rectal polyp: Secondary | ICD-10-CM | POA: Diagnosis not present

## 2019-04-14 DIAGNOSIS — D125 Benign neoplasm of sigmoid colon: Secondary | ICD-10-CM | POA: Diagnosis not present

## 2019-04-14 HISTORY — DX: Migraine, unspecified, not intractable, without status migrainosus: G43.909

## 2019-04-14 HISTORY — PX: COLONOSCOPY WITH PROPOFOL: SHX5780

## 2019-04-14 HISTORY — PX: POLYPECTOMY: SHX5525

## 2019-04-14 SURGERY — COLONOSCOPY WITH PROPOFOL
Anesthesia: General | Site: Rectum

## 2019-04-14 MED ORDER — LACTATED RINGERS IV SOLN: INTRAVENOUS | Status: DC

## 2019-04-14 MED ORDER — STERILE WATER FOR IRRIGATION IR SOLN
Status: DC | PRN
  Administered 2019-04-14: 50 mL

## 2019-04-14 MED ORDER — PROPOFOL 10 MG/ML IV BOLUS
INTRAVENOUS | Status: DC | PRN
  Administered 2019-04-14 (×2): 100 mg via INTRAVENOUS
  Administered 2019-04-14: 200 mg via INTRAVENOUS

## 2019-04-14 MED ORDER — LIDOCAINE HCL (CARDIAC) PF 100 MG/5ML IV SOSY
PREFILLED_SYRINGE | INTRAVENOUS | Status: DC | PRN
  Administered 2019-04-14: 30 mg via INTRAVENOUS

## 2019-04-14 SURGICAL SUPPLY — 12 items
CANISTER SUCT 1200ML W/VALVE (MISCELLANEOUS) ×2 IMPLANT
CLIP HMST 235XBRD CATH ROT (MISCELLANEOUS) ×1 IMPLANT
CLIP RESOLUTION 360 11X235 (MISCELLANEOUS) ×1
ELECT REM PT RETURN 9FT ADLT (ELECTROSURGICAL) ×2
ELECTRODE REM PT RTRN 9FT ADLT (ELECTROSURGICAL) ×1 IMPLANT
GOWN CVR UNV OPN BCK APRN NK (MISCELLANEOUS) ×2 IMPLANT
GOWN ISOL THUMB LOOP REG UNIV (MISCELLANEOUS) ×2
KIT ENDO PROCEDURE OLY (KITS) ×2 IMPLANT
KIT SYR ORISE GEL 23G CLEAR 2 (KITS) ×2 IMPLANT
SNARE SHORT THROW 13M SML OVAL (MISCELLANEOUS) ×2 IMPLANT
TRAP ETRAP POLY (MISCELLANEOUS) ×2 IMPLANT
WATER STERILE IRR 250ML POUR (IV SOLUTION) ×2 IMPLANT

## 2019-04-14 NOTE — H&P (Signed)
Geoffrey Lame, MD [facility] [address] [place], [place] [address] [phone] Fax : [phone]  Primary Care Physician:  Geoffrey Crouch, MD Primary Gastroenterologist:  Dr. Allen Ramirez  Pre-Procedure History & Physical: HPI:  Geoffrey Ramirez is a 59 y.o. male is here for an colonoscopy.   Past Medical History:  Diagnosis Date  . Chronic prostatitis   . Migraine headache    approx 1x/mo  . Stroke ([place]) 12/25/2017  . TIA (transient ischemic attack) 12/25/2017    Past Surgical History:  Procedure Laterality Date  . ANTERIOR CRUCIATE LIGAMENT REPAIR    . CHOLECYSTECTOMY    . PATENT FORAMEN OVALE(PFO) CLOSURE  03/26/2018  . TEE WITHOUT CARDIOVERSION N/A 01/22/2018   Procedure: TRANSESOPHAGEAL ECHOCARDIOGRAM (TEE);  Surgeon: Geoffrey Skains, MD;  Location: [facility] ORS;  Service: Cardiovascular;  Laterality: N/A;    Prior to Admission medications   Medication Sig Start Date End Date Taking? Authorizing Provider  amitriptyline (ELAVIL) 25 MG tablet Take 25 mg by mouth at bedtime as needed for sleep.   Yes [provider]  aspirin EC 81 MG tablet Take 81 mg by mouth daily.   Yes [provider]  atorvastatin (LIPITOR) 40 MG tablet Take 1 tablet (40 mg total) by mouth daily at 6 PM. Patient taking differently: Take 20 mg by mouth daily at 6 PM.  12/26/17  Yes Ramirez, Richard, MD  cetirizine (ZYRTEC) 10 MG tablet Take 10 mg by mouth daily.   Yes [provider]  Cholecalciferol (VITAMIN D) 2000 units CAPS Take 2,000 Units by mouth daily.    Yes [provider]  finasteride (PROSCAR) 5 MG tablet TAKE 1 TABLET BY MOUTH EVERY DAY Patient taking differently: Take 5 mg by mouth daily.  12/19/17  Yes Ramirez, Geoffrey How, MD  Multiple Vitamin (MULTIVITAMIN WITH MINERALS) TABS tablet Take 1 tablet by mouth daily.   Yes [provider]  Probiotic Product (ALIGN) 4 MG CAPS Take 4 mg by mouth daily.   Yes [provider]  SUMAtriptan  (IMITREX) 100 MG tablet Take 1 tablet (100 mg total) by mouth every 2 (two) hours as needed for migraine. Max 200mg  in 1 day 11/29/17  Yes Ramirez, Geoffrey P, DO  vardenafil (LEVITRA) 20 MG tablet TAKE 1 TABLET BY MOUTH EVERY DAY AS NEEDED 04/09/18  Yes Geoffrey Maple, MD    Allergies as of 03/25/2019  . (No Known Allergies)    Family History  Problem Relation Age of Onset  . Cancer Mother   . Mental illness Mother   . Diabetes Father   . Cancer Maternal Grandmother   . Diabetes Maternal Grandfather   . Heart disease Maternal Grandfather   . Stroke Maternal Grandfather   . Glaucoma Maternal Grandfather     Social History   Socioeconomic History  . Marital status: Married    Spouse name: Not on file  . Number of children: Not on file  . Years of education: Not on file  . Highest education level: Not on file  Occupational History  . Not on file  Tobacco Use  . Smoking status: Never Smoker  . Smokeless tobacco: Never Used  Substance and Sexual Activity  . Alcohol use: Not Currently    Comment: rare  . Drug use: No  . Sexual activity: Not on file  Other Topics Concern  . Not on file  Social History Narrative  . Not on file   Social Determinants of Health   Financial Resource  Strain:   . Difficulty of Paying Living Expenses: Not on file  Food Insecurity:   . Worried About Charity fundraiser in the Last Year: Not on file  . Ran Out of Food in the Last Year: Not on file  Transportation Needs:   . Lack of Transportation (Medical): Not on file  . Lack of Transportation (Non-Medical): Not on file  Physical Activity:   . Days of Exercise per Week: Not on file  . Minutes of Exercise per Session: Not on file  Stress:   . Feeling of Stress : Not on file  Social Connections:   . Frequency of Communication with Friends and Family: Not on file  . Frequency of Social Gatherings with Friends and Family: Not on file  . Attends Religious Services: Not on file  . Active Member  of Clubs or Organizations: Not on file  . Attends Archivist Meetings: Not on file  . Marital Status: Not on file  Intimate Partner Violence:   . Fear of Current or Ex-Partner: Not on file  . Emotionally Abused: Not on file  . Physically Abused: Not on file  . Sexually Abused: Not on file    Review of Systems: See HPI, otherwise negative ROS  Physical Exam: BP (!) 153/89   Pulse 97   Temp (!) 97.5 F (36.4 C) (Temporal)   Ht 6' (1.829 m)   Wt 86.2 kg   SpO2 99%   BMI 25.77 kg/m  General:   Alert,  pleasant and cooperative in NAD Head:  Normocephalic and atraumatic. Neck:  Supple; no masses or thyromegaly. Lungs:  Clear throughout to auscultation.    Heart:  Regular rate and rhythm. Abdomen:  Soft, nontender and nondistended. Normal bowel sounds, without guarding, and without rebound.   Neurologic:  Alert and  oriented x4;  grossly normal neurologically.  Impression/Plan: Geoffrey Ramirez is here for an colonoscopy to be performed for history of adenomatous polyps last colonoscopy 2015  Risks, benefits, limitations, and alternatives regarding  colonoscopy have been reviewed with the patient.  Questions have been answered.  All parties agreeable.   Geoffrey Lame, MD  04/14/2019, 8:32 AM

## 2019-04-14 NOTE — Transfer of Care (Signed)
Immediate Anesthesia Transfer of Care Note  Patient: Geoffrey Ramirez  Procedure(s) Performed: COLONOSCOPY WITH BIOPSY (N/A Rectum) POLYPECTOMY (N/A Rectum)  Patient Location: PACU  Anesthesia Type: General  Level of Consciousness: awake, alert  and patient cooperative  Airway and Oxygen Therapy: Patient Spontanous Breathing and Patient connected to supplemental oxygen  Post-op Assessment: Post-op Vital signs reviewed, Patient's Cardiovascular Status Stable, Respiratory Function Stable, Patent Airway and No signs of Nausea or vomiting  Post-op Vital Signs: Reviewed and stable  Complications: No apparent anesthesia complications

## 2019-04-14 NOTE — Anesthesia Postprocedure Evaluation (Signed)
Anesthesia Post Note  Patient: Geoffrey Ramirez  Procedure(s) Performed: COLONOSCOPY WITH BIOPSY (N/A Rectum) POLYPECTOMY (N/A Rectum)     Patient location during evaluation: PACU Anesthesia Type: General Level of consciousness: awake and alert Pain management: pain level controlled Vital Signs Assessment: post-procedure vital signs reviewed and stable Respiratory status: spontaneous breathing, nonlabored ventilation, respiratory function stable and patient connected to nasal cannula oxygen Cardiovascular status: blood pressure returned to baseline and stable Postop Assessment: no apparent nausea or vomiting Anesthetic complications: no    Lauren Aguayo

## 2019-04-14 NOTE — Anesthesia Procedure Notes (Signed)
Procedure Name: MAC Performed by: Crawford Tamura M, CRNA Pre-anesthesia Checklist: Timeout performed, Patient being monitored, Suction available, Emergency Drugs available and Patient identified Patient Re-evaluated:Patient Re-evaluated prior to induction Oxygen Delivery Method: Nasal cannula       

## 2019-04-14 NOTE — Anesthesia Preprocedure Evaluation (Signed)
Anesthesia Evaluation  Patient identified by MRN, date of birth, ID band Patient awake    Reviewed: NPO status   History of Anesthesia Complications Negative for: history of anesthetic complications  Airway Mallampati: II  TM Distance: >3 FB Neck ROM: full    Dental no notable dental hx.    Pulmonary neg pulmonary ROS,    Pulmonary exam normal        Cardiovascular Exercise Tolerance: Good Normal cardiovascular exam  echo: 10/2018: NORMAL LEFT VENTRICULAR SYSTOLIC FUNCTION  NORMAL LA PRESSURES WITH NORMAL DIASTOLIC FUNCTION  NORMAL RIGHT VENTRICULAR SYSTOLIC FUNCTION  VALVULAR REGURGITATION: TRIVIAL AR, TRIVIAL MR, MILD PR, TRIVIAL TR  NO VALVULAR STENOSIS  NEGATIVE SALINE MICROCAVITATION STUDY AT REST AND AFTER VALSALVA;  Jan 2020: 1. PFO and fenestration in the septum primum, modest bidirectional atrial level shunt by oximetry and ICE color Doppler. 2. Successful closure with a 30 mm Cardioform device;    Neuro/Psych  Headaches, Anxiety CVA (12/2017), No Residual Symptoms negative psych ROS   GI/Hepatic negative GI ROS, Neg liver ROS,   Endo/Other  negative endocrine ROS  Renal/GU negative Renal ROS   bph    Musculoskeletal   Abdominal   Peds  Hematology negative hematology ROS (+)   Anesthesia Other Findings Covid: NEG.  Last asprin: 4 days ago.  Reproductive/Obstetrics                            Anesthesia Physical Anesthesia Plan  ASA: II  Anesthesia Plan: General   Post-op Pain Management:    Induction:   PONV Risk Score and Plan: 2 and Propofol infusion and TIVA  Airway Management Planned:   Additional Equipment:   Intra-op Plan:   Post-operative Plan:   Informed Consent: I have reviewed the patients History and Physical, chart, labs and discussed the procedure including the risks, benefits and alternatives for the proposed anesthesia with the patient  or authorized representative who has indicated his/her understanding and acceptance.       Plan Discussed with: CRNA  Anesthesia Plan Comments:         Anesthesia Quick Evaluation

## 2019-04-14 NOTE — Anesthesia Procedure Notes (Signed)
Procedure Name: MAC Performed by: Zaylia Riolo M, CRNA Pre-anesthesia Checklist: Timeout performed, Patient being monitored, Suction available, Emergency Drugs available and Patient identified Patient Re-evaluated:Patient Re-evaluated prior to induction Oxygen Delivery Method: Nasal cannula       

## 2019-04-14 NOTE — Op Note (Signed)
[facility] Gastroenterology Patient Name: Geoffrey Ramirez Procedure Date: 04/14/2019 8:32 AM MRN: [id number] Account #: [id number] Date of Birth: 06-24-1960 Admit Type: Outpatient Age: 59 Room: [facility] OR ROOM 01 Gender: Male Note Status: Finalized Procedure:             Colonoscopy Indications:           High risk colon cancer surveillance: Personal history                         of colonic polyps Providers:             Lucilla Lame MD, MD Referring MD:          Leonie Douglas. Doy Hutching, MD (Referring MD) Medicines:             Propofol per Anesthesia Complications:         No immediate complications. Procedure:             Pre-Anesthesia Assessment:                        - Prior to the procedure, a History and Physical was                         performed, and patient medications and allergies were                         reviewed. The patient's tolerance of previous                         anesthesia was also reviewed. The risks and benefits                         of the procedure and the sedation options and risks                         were discussed with the patient. All questions were                         answered, and informed consent was obtained. Prior                         Anticoagulants: The patient has taken no previous                         anticoagulant or antiplatelet agents. ASA Grade                         Assessment: II - A patient with mild systemic disease.                         After reviewing the risks and benefits, the patient                         was deemed in satisfactory condition to undergo the                         procedure.  After obtaining informed consent, the colonoscope was                         passed under direct vision. Throughout the procedure,                         the patient's blood pressure, pulse, and oxygen                         saturations were monitored continuously. The                          Colonoscope was introduced through the anus and                         advanced to the the cecum, identified by appendiceal                         orifice and ileocecal valve. The colonoscopy was                         performed without difficulty. The patient tolerated                         the procedure well. The quality of the bowel                         preparation was excellent. Findings:      The perianal and digital rectal examinations were normal.      A 9 mm polyp was found in the transverse colon. The polyp was sessile.       Area was successfully injected with 2 mL saline with indigo carmine for       a lift polypectomy. The polyp was removed with a hot snare. Resection       and retrieval were complete. To prevent bleeding post-intervention, one       hemostatic clip was successfully placed (MR conditional). There was no       bleeding at the end of the procedure.      A 4 mm polyp was found in the sigmoid colon. The polyp was sessile. The       polyp was removed with a cold snare. Resection and retrieval were       complete.      A 4 mm polyp was found in the rectum. The polyp was sessile. The polyp       was removed with a cold snare. Resection and retrieval were complete.      Non-bleeding internal hemorrhoids were found during retroflexion. The       hemorrhoids were Grade I (internal hemorrhoids that do not prolapse). Impression:            - One 9 mm polyp in the transverse colon, removed with                         a hot snare. Resected and retrieved. Injected. Clip                         (MR conditional) was placed.                        -  One 4 mm polyp in the sigmoid colon, removed with a                         cold snare. Resected and retrieved.                        - One 4 mm polyp in the rectum, removed with a cold                         snare. Resected and retrieved.                        - Non-bleeding internal  hemorrhoids. Recommendation:        - Discharge patient to home.                        - Resume previous diet.                        - Continue present medications.                        - Await pathology results.                        - Repeat colonoscopy in 5 years for surveillance. Procedure Code(s):     --- Professional ---                        [phone], Colonoscopy, flexible; with removal of                         tumor(s), polyp(s), or other lesion(s) by snare                         technique                        [id number], Colonoscopy, flexible; with directed submucosal                         injection(s), any substance Diagnosis Code(s):     --- Professional ---                        Z86.010, Personal history of colonic polyps                        K63.5, Polyp of colon                        K62.1, Rectal polyp CPT copyright 2019 American Medical Association. All rights reserved. The codes documented in this report are preliminary and upon coder review may  be revised to meet current compliance requirements. Lucilla Lame MD, MD 04/14/2019 9:02:39 AM This report has been signed electronically. Number of Addenda: 0 Note Initiated On: 04/14/2019 8:32 AM Scope Withdrawal Time: 0 hours 14 minutes 14 seconds  Total Procedure Duration: 0 hours 18 minutes 16 seconds  Estimated Blood Loss:  Estimated blood loss: none.      [facility]

## 2019-04-15 ENCOUNTER — Encounter: Payer: Self-pay | Admitting: *Deleted

## 2019-04-16 ENCOUNTER — Encounter: Payer: Self-pay | Admitting: Gastroenterology

## 2019-04-16 LAB — SURGICAL PATHOLOGY

## 2019-04-24 ENCOUNTER — Ambulatory Visit: Admit: 2019-04-24 | Payer: BC Managed Care – PPO | Admitting: Gastroenterology

## 2019-04-24 SURGERY — COLONOSCOPY WITH PROPOFOL
Anesthesia: General

## 2019-05-04 ENCOUNTER — Other Ambulatory Visit: Payer: Self-pay

## 2019-05-04 NOTE — Telephone Encounter (Signed)
LOV: 08/06/2018 with Golden Pop, MD. No future visit scheduled.

## 2019-05-12 DIAGNOSIS — Z0189 Encounter for other specified special examinations: Secondary | ICD-10-CM | POA: Diagnosis not present

## 2019-05-26 DIAGNOSIS — Z043 Encounter for examination and observation following other accident: Secondary | ICD-10-CM | POA: Diagnosis not present

## 2019-05-26 DIAGNOSIS — Z713 Dietary counseling and surveillance: Secondary | ICD-10-CM | POA: Diagnosis not present

## 2019-05-26 DIAGNOSIS — E785 Hyperlipidemia, unspecified: Secondary | ICD-10-CM | POA: Diagnosis not present

## 2019-05-27 DIAGNOSIS — Z23 Encounter for immunization: Secondary | ICD-10-CM | POA: Diagnosis not present

## 2019-06-29 DIAGNOSIS — Z23 Encounter for immunization: Secondary | ICD-10-CM | POA: Diagnosis not present

## 2019-07-02 DIAGNOSIS — Q211 Atrial septal defect: Secondary | ICD-10-CM | POA: Diagnosis not present

## 2019-07-02 DIAGNOSIS — I10 Essential (primary) hypertension: Secondary | ICD-10-CM | POA: Diagnosis not present

## 2019-07-02 DIAGNOSIS — R7303 Prediabetes: Secondary | ICD-10-CM | POA: Diagnosis not present

## 2019-07-02 DIAGNOSIS — E782 Mixed hyperlipidemia: Secondary | ICD-10-CM | POA: Diagnosis not present

## 2019-07-09 DIAGNOSIS — Z0131 Encounter for examination of blood pressure with abnormal findings: Secondary | ICD-10-CM | POA: Diagnosis not present

## 2019-07-16 DIAGNOSIS — I10 Essential (primary) hypertension: Secondary | ICD-10-CM | POA: Diagnosis not present

## 2019-07-16 DIAGNOSIS — E782 Mixed hyperlipidemia: Secondary | ICD-10-CM | POA: Diagnosis not present

## 2019-07-16 DIAGNOSIS — I63422 Cerebral infarction due to embolism of left anterior cerebral artery: Secondary | ICD-10-CM | POA: Diagnosis not present

## 2019-07-16 DIAGNOSIS — Q211 Atrial septal defect: Secondary | ICD-10-CM | POA: Diagnosis not present

## 2019-08-11 DIAGNOSIS — Z0130 Encounter for examination of blood pressure without abnormal findings: Secondary | ICD-10-CM | POA: Diagnosis not present

## 2019-08-27 DIAGNOSIS — X32XXXA Exposure to sunlight, initial encounter: Secondary | ICD-10-CM | POA: Diagnosis not present

## 2019-08-27 DIAGNOSIS — D2261 Melanocytic nevi of right upper limb, including shoulder: Secondary | ICD-10-CM | POA: Diagnosis not present

## 2019-08-27 DIAGNOSIS — D2262 Melanocytic nevi of left upper limb, including shoulder: Secondary | ICD-10-CM | POA: Diagnosis not present

## 2019-08-27 DIAGNOSIS — L570 Actinic keratosis: Secondary | ICD-10-CM | POA: Diagnosis not present

## 2019-08-27 DIAGNOSIS — D2272 Melanocytic nevi of left lower limb, including hip: Secondary | ICD-10-CM | POA: Diagnosis not present

## 2019-08-27 DIAGNOSIS — D225 Melanocytic nevi of trunk: Secondary | ICD-10-CM | POA: Diagnosis not present

## 2019-08-27 DIAGNOSIS — D485 Neoplasm of uncertain behavior of skin: Secondary | ICD-10-CM | POA: Diagnosis not present

## 2019-09-08 DIAGNOSIS — H00015 Hordeolum externum left lower eyelid: Secondary | ICD-10-CM | POA: Diagnosis not present

## 2019-10-06 DIAGNOSIS — D485 Neoplasm of uncertain behavior of skin: Secondary | ICD-10-CM | POA: Diagnosis not present

## 2019-10-06 DIAGNOSIS — D225 Melanocytic nevi of trunk: Secondary | ICD-10-CM | POA: Diagnosis not present

## 2019-10-22 DIAGNOSIS — R350 Frequency of micturition: Secondary | ICD-10-CM | POA: Diagnosis not present

## 2019-10-26 DIAGNOSIS — R350 Frequency of micturition: Secondary | ICD-10-CM | POA: Diagnosis not present

## 2019-11-05 DIAGNOSIS — I63422 Cerebral infarction due to embolism of left anterior cerebral artery: Secondary | ICD-10-CM | POA: Diagnosis not present

## 2019-11-05 DIAGNOSIS — Q211 Atrial septal defect: Secondary | ICD-10-CM | POA: Diagnosis not present

## 2019-11-06 ENCOUNTER — Other Ambulatory Visit: Payer: Self-pay

## 2019-11-06 ENCOUNTER — Ambulatory Visit (INDEPENDENT_AMBULATORY_CARE_PROVIDER_SITE_OTHER): Payer: BC Managed Care – PPO | Admitting: Urology

## 2019-11-06 ENCOUNTER — Encounter: Payer: Self-pay | Admitting: Urology

## 2019-11-06 VITALS — BP 143/84 | HR 73 | Ht 72.0 in | Wt 192.0 lb

## 2019-11-06 DIAGNOSIS — N411 Chronic prostatitis: Secondary | ICD-10-CM | POA: Diagnosis not present

## 2019-11-06 DIAGNOSIS — N401 Enlarged prostate with lower urinary tract symptoms: Secondary | ICD-10-CM

## 2019-11-06 DIAGNOSIS — Z87442 Personal history of urinary calculi: Secondary | ICD-10-CM

## 2019-11-06 DIAGNOSIS — R350 Frequency of micturition: Secondary | ICD-10-CM

## 2019-11-06 LAB — URINALYSIS, COMPLETE
Bilirubin, UA: NEGATIVE
Ketones, UA: NEGATIVE
Leukocytes,UA: NEGATIVE
Nitrite, UA: NEGATIVE
Protein,UA: NEGATIVE
Specific Gravity, UA: 1.02 (ref 1.005–1.030)
Urobilinogen, Ur: 0.2 mg/dL (ref 0.2–1.0)
pH, UA: 7 (ref 5.0–7.5)

## 2019-11-06 LAB — MICROSCOPIC EXAMINATION
Bacteria, UA: NONE SEEN
Epithelial Cells (non renal): NONE SEEN /hpf (ref 0–10)

## 2019-11-06 LAB — BLADDER SCAN AMB NON-IMAGING: Scan Result: 33

## 2019-11-06 MED ORDER — TAMSULOSIN HCL 0.4 MG PO CAPS: 0.4000 mg | ORAL_CAPSULE | Freq: Every day | ORAL | 2 refills | Status: DC

## 2019-11-06 NOTE — Progress Notes (Signed)
11/06/2019 11:38 AM   [name] 1961-01-03 [id number]  Referring provider: Serita Butcher, [place],  [place] [address]  Chief Complaint  Patient presents with  . Prostatitis    HPI: Geoffrey Ramirez is a 59 y.o. male seen at request of Tammy Holbrooks, FNP for evaluation of prostatitis and lower urinary tract symptoms.   ~3 weeks ago had onset of urinary frequency, low back pain and suprapubic pain  Denied dysuria, gross hematuria  Saw employer medical provider on 10/22/2019  UA cloudy but negative nitrites, blood or leukocytes  Treated empirically with a 2-week course Cipro  Urine culture ordered and no growth  Finish Cipro course yesterday  No improvement in symptoms  IPSS 18/35      Saw Dr. Tresa Moore in 2014 for gross hematuria and had CT/cystoscopy  I [facility] at [facility] June 2014 for possible prostatitis and he subsequently passed a stone  On finasteride for BPH  Has been asymptomatic since 2015 until current episode  PMH: Past Medical History:  Diagnosis Date  . Chronic prostatitis   . Migraine headache    approx 1x/mo  . Stroke ([place]) 12/25/2017  . TIA (transient ischemic attack) 12/25/2017    Surgical History: Past Surgical History:  Procedure Laterality Date  . ANTERIOR CRUCIATE LIGAMENT REPAIR    . CHOLECYSTECTOMY    . COLONOSCOPY WITH PROPOFOL N/A 04/14/2019   Procedure: COLONOSCOPY WITH BIOPSY;  Surgeon: Lucilla Lame, MD;  Location: [place];  Service: Endoscopy;  Laterality: N/A;  . PATENT FORAMEN OVALE(PFO) CLOSURE  03/26/2018  . POLYPECTOMY N/A 04/14/2019   Procedure: POLYPECTOMY;  Surgeon: Lucilla Lame, MD;  Location: [employer];  Service: Endoscopy;  Laterality: N/A;  One clip placed at site of Transverse Colon Polyp removal  . TEE WITHOUT CARDIOVERSION N/A 01/22/2018   Procedure: TRANSESOPHAGEAL ECHOCARDIOGRAM (TEE);  Surgeon: Corey Skains, MD;  Location: [facility] ORS;  Service: Cardiovascular;  Laterality:  N/A;    Home Medications:  Allergies as of 11/06/2019      Reactions   Shellfish Allergy Swelling   Angioedema      Medication List       Accurate as of November 06, 2019 11:38 AM. If you have any questions, ask your nurse or doctor.        Align 4 MG Caps Take 4 mg by mouth daily.   amitriptyline 25 MG tablet Commonly known as: ELAVIL Take 25 mg by mouth at bedtime as needed for sleep.   aspirin EC 81 MG tablet Take 81 mg by mouth daily.   atorvastatin 40 MG tablet Commonly known as: LIPITOR Take 1 tablet (40 mg total) by mouth daily at 6 PM. What changed: how much to take   cetirizine 10 MG tablet Commonly known as: ZYRTEC Take 10 mg by mouth daily.   finasteride 5 MG tablet Commonly known as: PROSCAR TAKE 1 TABLET BY MOUTH EVERY DAY   multivitamin with minerals Tabs tablet Take 1 tablet by mouth daily.   SUMAtriptan 100 MG tablet Commonly known as: IMITREX Take 1 tablet (100 mg total) by mouth every 2 (two) hours as needed for migraine. Max 200mg  in 1 day   vardenafil 20 MG tablet Commonly known as: LEVITRA TAKE 1 TABLET BY MOUTH EVERY DAY AS NEEDED   Vitamin D 50 MCG (2000 UT) Caps Take 2,000 Units by mouth daily.       Allergies:  Allergies  Allergen Reactions  . Shellfish Allergy Swelling  Angioedema     Family History: Family History  Problem Relation Age of Onset  . Cancer Mother   . Mental illness Mother   . Diabetes Father   . Cancer Maternal Grandmother   . Diabetes Maternal Grandfather   . Heart disease Maternal Grandfather   . Stroke Maternal Grandfather   . Glaucoma Maternal Grandfather     Social History:  reports that he has never smoked. He has never used smokeless tobacco. He reports previous alcohol use. He reports that he does not use drugs.   Physical Exam: BP (!) 143/84   Pulse 73   Ht 6' (1.829 m)   Wt 192 lb (87.1 kg)   BMI 26.04 kg/m   Constitutional:  Alert and oriented, No acute distress. HEENT: [place] AT,  moist mucus membranes.  Trachea midline, no masses. Cardiovascular: No clubbing, cyanosis, or edema. Respiratory: Normal respiratory effort, no increased work of breathing. GI: Abdomen is soft, nontender, nondistended, no abdominal masses GU: Phallus without lesions, testes descended bilaterally without masses or tenderness, spermatic cord/epididymis palpably normal bilaterally; prostate 40 g, boggy, nontender Skin: No rashes, bruises or suspicious lesions. Neurologic: Grossly intact, no focal deficits, moving all 4 extremities. Psychiatric: Normal mood and affect.  Laboratory Data:  Urinalysis Dipstick trace blood/1+ glucose Microscopy negative   Assessment & Plan:    1.  BPH with LUTS  Worsening lower urinary tract symptoms most likely a combination of BPH and chronic nonbacterial prostatitis exacerbation  He completed a 2-week course of Cipro and culture was negative, symptoms have not improved  UA today negative and would not recommend additional antibiotics  Bladder scan PVR 33 mL  Rx tamsulosin sent to pharmacy  Follow-up 1 month for symptom reassessment  2.  Chronic prostatitis  Nonbacterial prostatitis exacerbation  As above  3.  History urinary tract stone  Consider imaging if symptoms have not improved on follow-up   Abbie Sons, MD  [place] [address] [place], [place] [address] [phone]

## 2019-11-09 DIAGNOSIS — J069 Acute upper respiratory infection, unspecified: Secondary | ICD-10-CM | POA: Diagnosis not present

## 2019-11-12 DIAGNOSIS — R7309 Other abnormal glucose: Secondary | ICD-10-CM | POA: Diagnosis not present

## 2019-12-01 DIAGNOSIS — Z23 Encounter for immunization: Secondary | ICD-10-CM | POA: Diagnosis not present

## 2019-12-11 NOTE — Progress Notes (Signed)
12/14/2019 9:06 AM   Geoffrey Ramirez Mar 03, 1961 [id number]  Referring provider: Idelle Crouch, MD [facility] [place],  [place] [address] Chief Complaint  Patient presents with  . Benign Prostatic Hypertrophy    Urologic history: 1.  BPH with LUTS -On tamsulosin/finasteride  2.  Chronic nonbacterial prostatitis -Improved on tamsulosin  3.  History gross hematuria -Evaluation Dr. Tresa Moore 2014 with CT/cystoscopy   4. History stone disease -Passed calculus 2014    HPI: Geoffrey Ramirez is a 59 y.o. male who returns for a 6 week follow up of BPH with LUTS, chronic prostatitis and history of urinary tract stone.    Seen 11/06/2019 with urinary frequency, low back pain and suprapubic pain x 3 weeks.  Denied dysuria, gross hematuria  Saw employer medical provider on 10/22/2019. UA cloudy but negative nitrites, blood or leukocytes. Negative urine culture.  Treated empirically with a 2-week course Cipro without improvement  I added tamsulosin with 1 month follow-up recommended  His pain has almost resolved. His urinary frequency has decreased.   He is satisfied with his current urinary symptoms.   PVR 35 mL    IPSS    Row Name 12/14/19 0800         International Prostate Symptom Score   How often have you had the sensation of not emptying your bladder? Less than 1 in 5     How often have you had to urinate less than every two hours? Less than 1 in 5 times     How often have you found you stopped and started again several times when you urinated? Less than 1 in 5 times     How often have you found it difficult to postpone urination? Not at All     How often have you had a weak urinary stream? Less than 1 in 5 times     How often have you had to strain to start urination? Not at All     How many times did you typically get up at night to urinate? None     Total IPSS Score 4       Quality of Life due to urinary symptoms   If you were to  spend the rest of your life with your urinary condition just the way it is now how would you feel about that? Pleased            Score:  1-7 Mild 8-19 Moderate 20-35 Severe   PMH: Past Medical History:  Diagnosis Date  . Chronic prostatitis   . Migraine headache    approx 1x/mo  . Stroke ([place]) 12/25/2017  . TIA (transient ischemic attack) 12/25/2017    Surgical History: Past Surgical History:  Procedure Laterality Date  . ANTERIOR CRUCIATE LIGAMENT REPAIR    . CHOLECYSTECTOMY    . COLONOSCOPY WITH PROPOFOL N/A 04/14/2019   Procedure: COLONOSCOPY WITH BIOPSY;  Surgeon: Lucilla Lame, MD;  Location: [place];  Service: Endoscopy;  Laterality: N/A;  . PATENT FORAMEN OVALE(PFO) CLOSURE  03/26/2018  . POLYPECTOMY N/A 04/14/2019   Procedure: POLYPECTOMY;  Surgeon: Lucilla Lame, MD;  Location: [place];  Service: Endoscopy;  Laterality: N/A;  One clip placed at site of Transverse Colon Polyp removal  . TEE WITHOUT CARDIOVERSION N/A 01/22/2018   Procedure: TRANSESOPHAGEAL ECHOCARDIOGRAM (TEE);  Surgeon: Corey Skains, MD;  Location: [facility];  Service: Cardiovascular;  Laterality: N/A;    Home Medications:  Allergies as of 12/14/2019  Reactions   Shellfish Allergy Swelling   Angioedema      Medication List       Accurate as of December 14, 2019  9:06 AM. If you have any questions, ask your nurse or doctor.        Align 4 MG Caps Take 4 mg by mouth daily.   amitriptyline 25 MG tablet Commonly known as: ELAVIL Take 25 mg by mouth at bedtime as needed for sleep.   aspirin EC 81 MG tablet Take 81 mg by mouth daily.   atorvastatin 40 MG tablet Commonly known as: LIPITOR Take 1 tablet (40 mg total) by mouth daily at 6 PM. What changed: how much to take   cetirizine 10 MG tablet Commonly known as: ZYRTEC Take 10 mg by mouth daily.   finasteride 5 MG tablet Commonly known as: PROSCAR TAKE 1 TABLET BY MOUTH EVERY DAY   multivitamin with  minerals Tabs tablet Take 1 tablet by mouth daily.   SUMAtriptan 100 MG tablet Commonly known as: IMITREX Take 1 tablet (100 mg total) by mouth every 2 (two) hours as needed for migraine. Max 200mg  in 1 day   tamsulosin 0.4 MG Caps capsule Commonly known as: FLOMAX Take 1 capsule (0.4 mg total) by mouth daily.   vardenafil 20 MG tablet Commonly known as: LEVITRA TAKE 1 TABLET BY MOUTH EVERY DAY AS NEEDED   Vitamin D 50 MCG (2000 UT) Caps Take 2,000 Units by mouth daily.       Allergies:  Allergies  Allergen Reactions  . Shellfish Allergy Swelling    Angioedema     Family History: Family History  Problem Relation Age of Onset  . Cancer Mother   . Mental illness Mother   . Diabetes Father   . Cancer Maternal Grandmother   . Diabetes Maternal Grandfather   . Heart disease Maternal Grandfather   . Stroke Maternal Grandfather   . Glaucoma Maternal Grandfather     Social History:  reports that he has never smoked. He has never used smokeless tobacco. He reports previous alcohol use. He reports that he does not use drugs.   Physical Exam: BP 127/77   Pulse (!) 105   Ht 6' (1.829 m)   Wt 186 lb (84.4 kg)   BMI 25.23 kg/m   Constitutional:  Alert and oriented, No acute distress. HEENT: [place] AT, moist mucus membranes.  Trachea midline, no masses. Cardiovascular: No clubbing, cyanosis, or edema. Respiratory: Normal respiratory effort, no increased work of breathing. Skin: No rashes, bruises or suspicious lesions. Neurologic: Grossly intact, no focal deficits, moving all 4 extremities. Psychiatric: Normal mood and affect.  Laboratory Data:  Pertinent Imaging: Results for orders placed or performed in visit on 12/14/19  Bladder Scan (Post Void Residual) in office  Result Value Ref Range   Scan Result 35     Assessment & Plan:    1. BPH with LUTS -Lower urinary tract symptoms are improving on tamsuolsin.  -Bladder scan by PVR 35 mL -IPSS score: 4,  mild. -Tamsulosin refill sent to pharmacy.  -RTC in 1 year and sooner if he experiences any worsening symptoms.   2. Chronic prostatitis -Nonbacterial prostatitis exacerbation -As above   [facility] [address], [place], [place] [address] [phone]  I, Selena Batten, am acting as a scribe for Dr. Nicki Reaper C. Karson Reede,  I have reviewed the above documentation for accuracy and completeness, and I agree with the above.   Janayah Zavada C Cynia Abruzzo,  MD   

## 2019-12-14 ENCOUNTER — Ambulatory Visit (INDEPENDENT_AMBULATORY_CARE_PROVIDER_SITE_OTHER): Payer: BC Managed Care – PPO | Admitting: Urology

## 2019-12-14 ENCOUNTER — Other Ambulatory Visit: Payer: Self-pay

## 2019-12-14 ENCOUNTER — Encounter: Payer: Self-pay | Admitting: Urology

## 2019-12-14 VITALS — BP 127/77 | HR 105 | Ht 72.0 in | Wt 186.0 lb

## 2019-12-14 DIAGNOSIS — N411 Chronic prostatitis: Secondary | ICD-10-CM

## 2019-12-14 DIAGNOSIS — N401 Enlarged prostate with lower urinary tract symptoms: Secondary | ICD-10-CM

## 2019-12-14 LAB — BLADDER SCAN AMB NON-IMAGING: Scan Result: 35

## 2019-12-14 MED ORDER — TAMSULOSIN HCL 0.4 MG PO CAPS
0.4000 mg | ORAL_CAPSULE | Freq: Every day | ORAL | 3 refills | Status: DC
Start: 2019-12-14 — End: 2020-10-24

## 2020-01-14 ENCOUNTER — Other Ambulatory Visit (HOSPITAL_COMMUNITY): Payer: Self-pay | Admitting: Internal Medicine

## 2020-01-14 ENCOUNTER — Other Ambulatory Visit: Payer: Self-pay | Admitting: Internal Medicine

## 2020-01-14 DIAGNOSIS — I63422 Cerebral infarction due to embolism of left anterior cerebral artery: Secondary | ICD-10-CM | POA: Diagnosis not present

## 2020-01-14 DIAGNOSIS — Z0000 Encounter for general adult medical examination without abnormal findings: Secondary | ICD-10-CM | POA: Diagnosis not present

## 2020-01-14 DIAGNOSIS — R413 Other amnesia: Secondary | ICD-10-CM

## 2020-01-14 DIAGNOSIS — Q211 Atrial septal defect: Secondary | ICD-10-CM | POA: Diagnosis not present

## 2020-01-14 DIAGNOSIS — I10 Essential (primary) hypertension: Secondary | ICD-10-CM | POA: Diagnosis not present

## 2020-01-28 ENCOUNTER — Other Ambulatory Visit: Payer: Self-pay

## 2020-01-28 ENCOUNTER — Ambulatory Visit
Admission: RE | Admit: 2020-01-28 | Discharge: 2020-01-28 | Disposition: A | Discharge status: Home / Self Care | Payer: BC Managed Care – PPO | Source: Ambulatory Visit | Attending: Internal Medicine | Admitting: Internal Medicine

## 2020-01-28 DIAGNOSIS — R413 Other amnesia: Secondary | ICD-10-CM | POA: Diagnosis not present

## 2020-01-28 DIAGNOSIS — Z8673 Personal history of transient ischemic attack (TIA), and cerebral infarction without residual deficits: Secondary | ICD-10-CM | POA: Diagnosis not present

## 2020-06-11 IMAGING — MR MR HEAD W/O CM
10 series · 48 of 48 positions shown · non-contrast
Comparison: None.

CLINICAL DATA: Acute nonintractable headache, unspecified headache
type R51 ([id number]). These are occasionally associated with blurred
vision and scotomata.

EXAM:
MRI HEAD WITHOUT CONTRAST
TECHNIQUE: Multiplanar, multiecho pulse sequences of the brain and surrounding
structures were obtained without intravenous contrast.

[Series 3: DWI · axial · 3.0mm · 1.20mm/px · z∈[-70,+97]mm · 6 of 57 slices shown (1 of 4)]
[im 1/57]
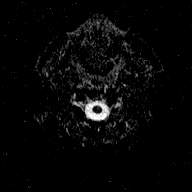
[im 12/57]
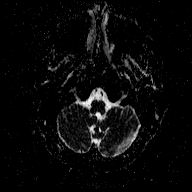
[im 23/57]
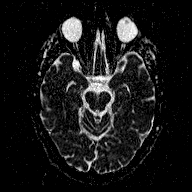
[im 34/57]
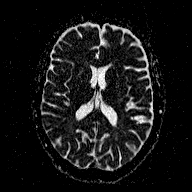
[im 45/57]
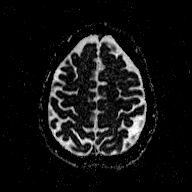
[im 57/57]
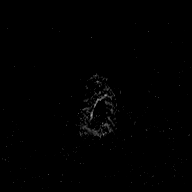

[Series 5: DWI · coronal · 3.0mm · 1.15mm/px · 4 of 49 slices shown (2 of 4)]
[im 1/49]
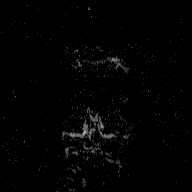
[im 17/49]
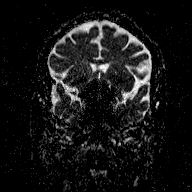
[im 33/49]
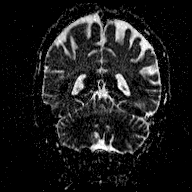
[im 49/49]
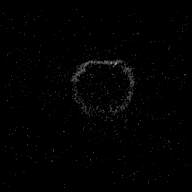

[Series 7: T2 · axial · 5.0mm · 0.72mm/px · z∈[-72,+95]mm · 2 of 25 slices shown (1 of 3)]
[im 1/25]
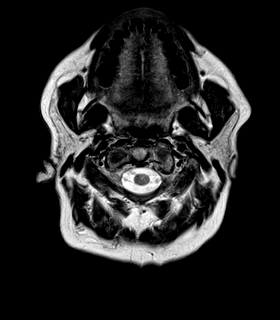
[im 25/25]
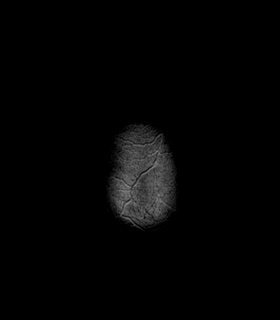

[Series 8: FLAIR · axial · 3.0mm · 0.45mm/px · z∈[-72,+95]mm · 5 of 57 slices shown]
[im 1/57]
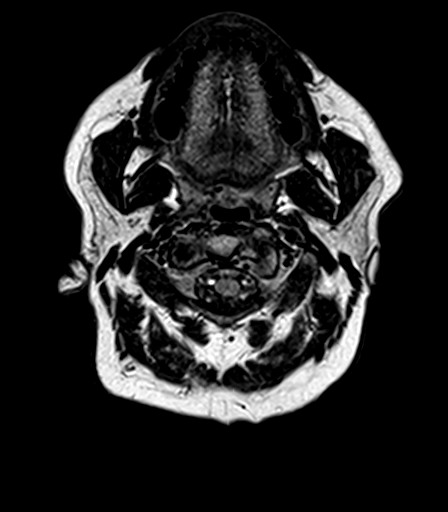
[im 15/57]
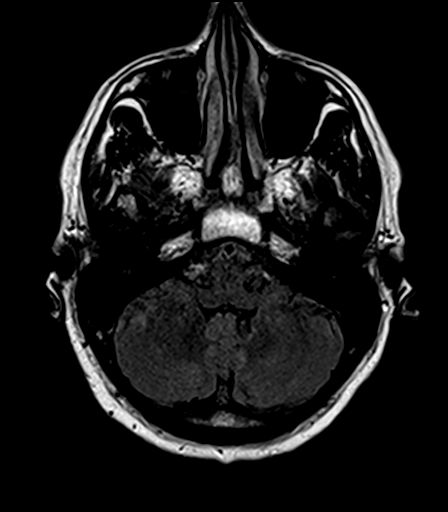
[im 29/57]
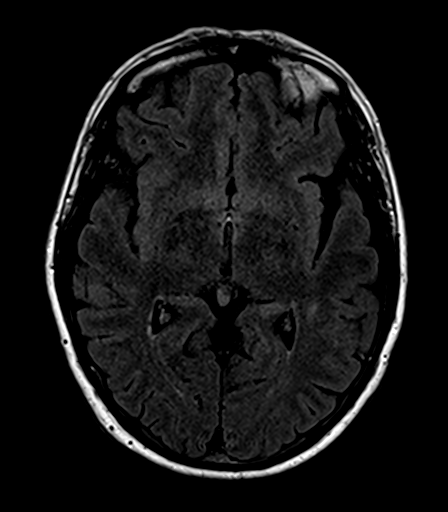
[im 43/57]
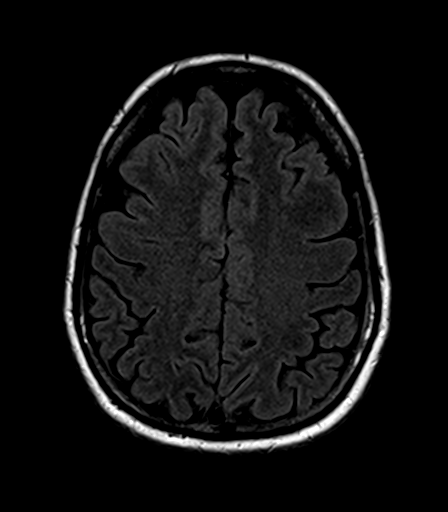
[im 57/57]
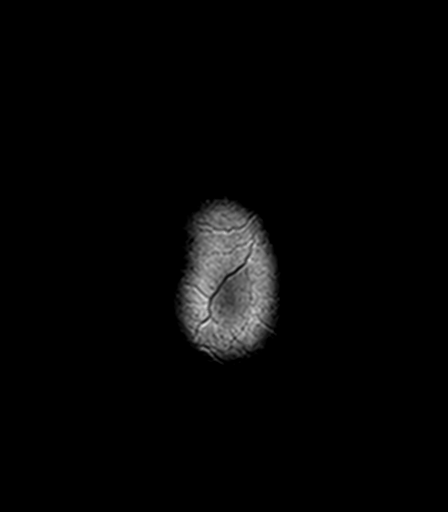

[Series 9: T2 · axial · 5.0mm · 0.72mm/px · z∈[-72,+95]mm · 2 of 25 slices shown (2 of 3)]
[im 1/25]
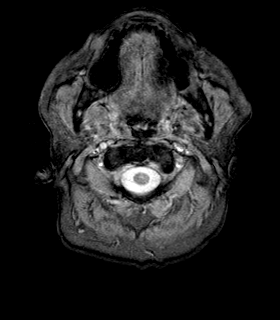
[im 25/25]
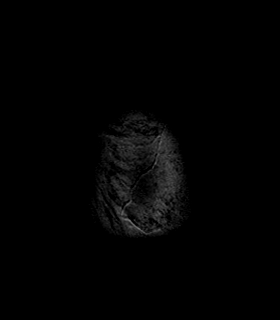

[Series 10: T1 · axial · 1.0mm · 1.00mm/px · z∈[-66,+91]mm · 14 of 160 slices shown (1 of 2)]
[im 1/160]
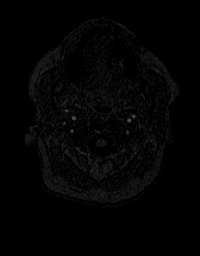
[im 13/160]
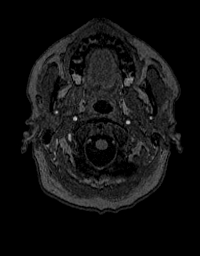
[im 25/160]
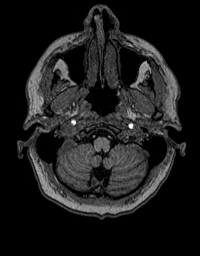
[im 37/160]
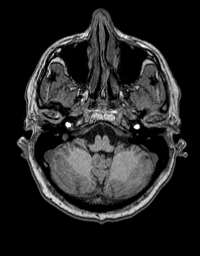
[im 49/160]
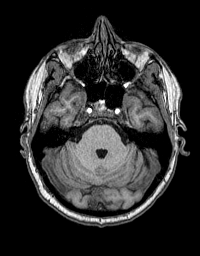
[im 62/160]
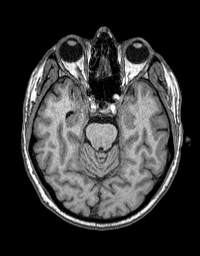
[im 74/160]
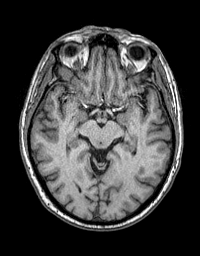
[im 86/160]
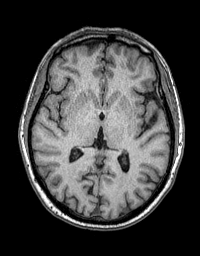
[im 98/160]
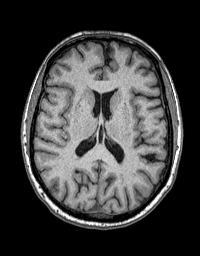
[im 111/160]
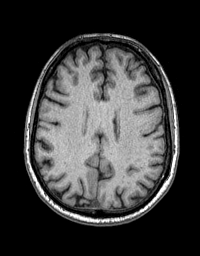
[im 123/160]
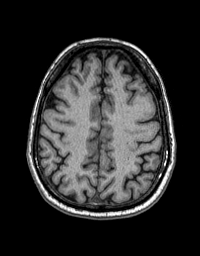
[im 135/160]
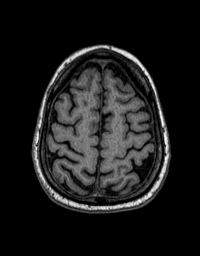
[im 147/160]
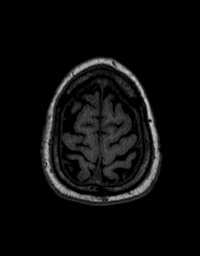
[im 160/160]
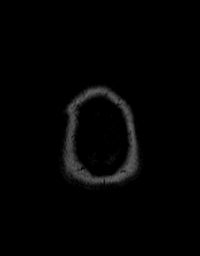

[Series 11: T2 · coronal · 5.0mm · 0.43mm/px · 3 of 31 slices shown (3 of 3)]
[im 1/31]
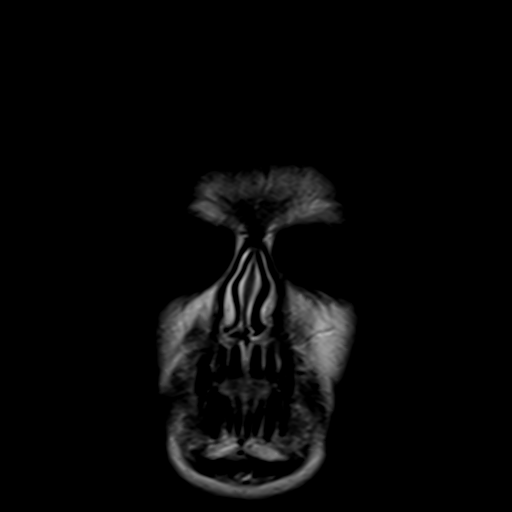
[im 16/31]
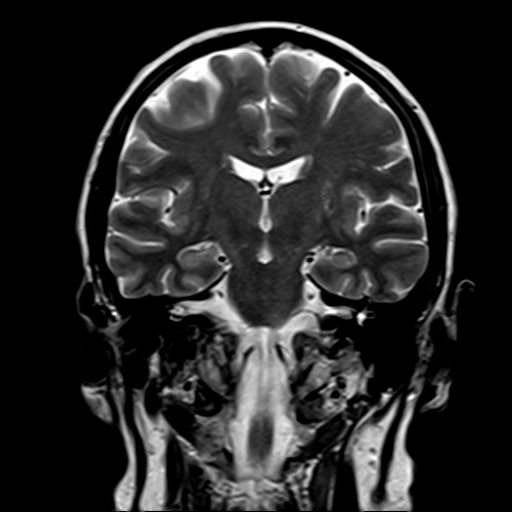
[im 31/31]
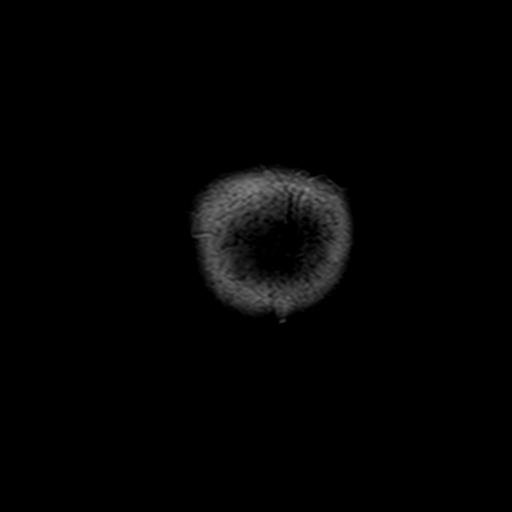

[Series 12: T1 · sagittal · 5.0mm · 0.45mm/px · 2 of 25 slices shown (2 of 2)]
[im 1/25]
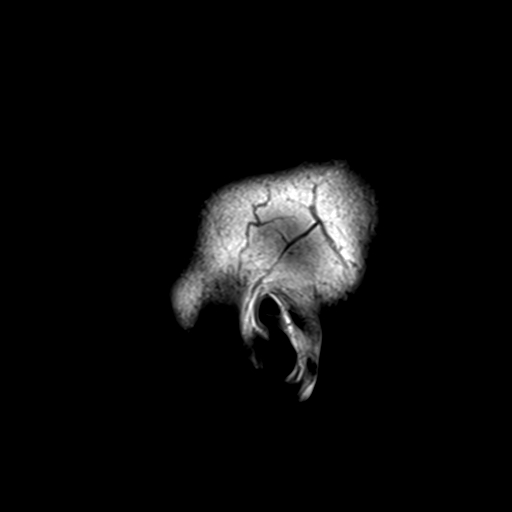
[im 25/25]
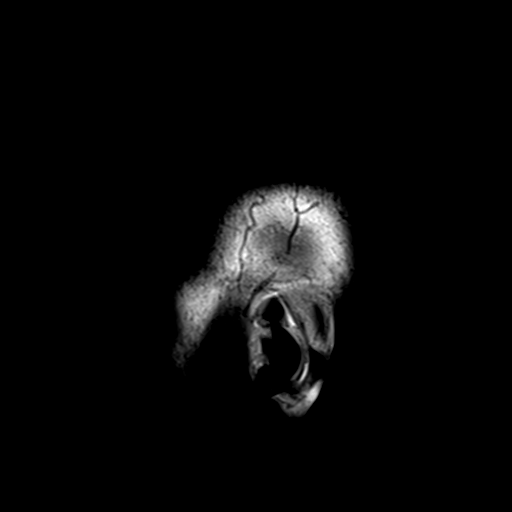

[Series 100: DWI · axial · 3.0mm · 1.20mm/px · z∈[-70,+97]mm · 5 of 57 slices shown (3 of 4)]
[im 1/57]
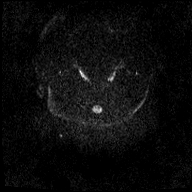
[im 15/57]
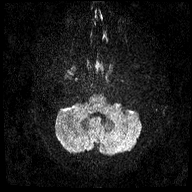
[im 29/57]
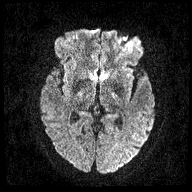
[im 43/57]
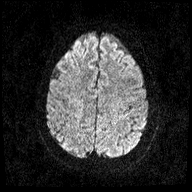
[im 57/57]
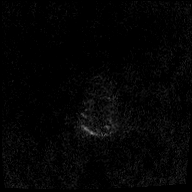

[Series 101: DWI · coronal · 3.0mm · 1.15mm/px · 5 of 51 slices shown (4 of 4)]
[im 1/51]
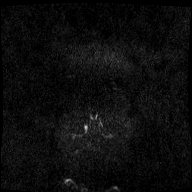
[im 13/51]
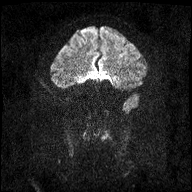
[im 26/51]
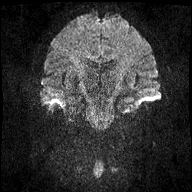
[im 38/51]
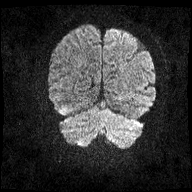
[im 51/51]
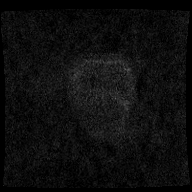

[48 of 48 positions shown; findings below may reference images not displayed]

FINDINGS: Brain: 3 mm focus of restricted diffusion, RIGHT frontal cortex,
corresponding low ADC, consistent with acute infarction. No visible
hemorrhage, hydrocephalus, extra-axial collection or mass lesion.
Normal for age cerebral volume.

Scattered T2 and FLAIR hyperintensities in the periventricular and
subcortical white matter, nonspecific, but statistically most
commonly associated with chronic microvascular ischemic change.
Other considerations would include complicated migraine, vasculitis,
chronic infection, or demyelinating disease. Mild prominence
perivascular spaces.

Vascular: Normal flow voids.

Skull and upper cervical spine: Normal marrow signal.

Sinuses/Orbits: Negative.

Other: None.
IMPRESSION: 3 mm focus of acute infarction, RIGHT frontal cortex. No hemorrhage
or mass effect. Significance uncertain.

Mild T2 and FLAIR hyperintensities in the periventricular and
subcortical white matter, favored to represent chronic microvascular
ischemic change.

No intracranial or extracranial cause for headaches is observed.

These results will be called to the ordering clinician or
representative by the Radiologist Assistant, and communication
documented in the PACS or zVision Dashboard.

## 2020-06-17 ENCOUNTER — Other Ambulatory Visit: Payer: Self-pay | Admitting: Internal Medicine

## 2020-06-17 DIAGNOSIS — Z803 Family history of malignant neoplasm of breast: Secondary | ICD-10-CM

## 2020-06-21 ENCOUNTER — Ambulatory Visit
Admission: RE | Admit: 2020-06-21 | Discharge: 2020-06-21 | Disposition: A | Discharge status: Home / Self Care | Payer: BC Managed Care – PPO | Source: Ambulatory Visit | Attending: Internal Medicine | Admitting: Internal Medicine

## 2020-06-21 ENCOUNTER — Other Ambulatory Visit: Payer: Self-pay

## 2020-06-21 DIAGNOSIS — Z803 Family history of malignant neoplasm of breast: Secondary | ICD-10-CM

## 2020-10-22 ENCOUNTER — Other Ambulatory Visit: Payer: Self-pay | Admitting: Urology

## 2020-12-14 ENCOUNTER — Encounter: Payer: Self-pay | Admitting: Urology

## 2020-12-14 ENCOUNTER — Other Ambulatory Visit: Payer: Self-pay

## 2020-12-14 ENCOUNTER — Ambulatory Visit: Payer: BC Managed Care – PPO | Admitting: Urology

## 2020-12-14 VITALS — BP 115/78 | HR 71 | Ht 72.0 in | Wt 193.0 lb

## 2020-12-14 DIAGNOSIS — N401 Enlarged prostate with lower urinary tract symptoms: Secondary | ICD-10-CM | POA: Diagnosis not present

## 2020-12-14 DIAGNOSIS — N5201 Erectile dysfunction due to arterial insufficiency: Secondary | ICD-10-CM

## 2020-12-14 LAB — BLADDER SCAN AMB NON-IMAGING: Scan Result: 0

## 2020-12-14 MED ORDER — TADALAFIL 20 MG PO TABS: ORAL_TABLET | ORAL | 0 refills | Status: DC

## 2020-12-14 MED ORDER — FINASTERIDE 5 MG PO TABS: 5.0000 mg | ORAL_TABLET | Freq: Every day | ORAL | 4 refills | Status: DC

## 2020-12-14 MED ORDER — TAMSULOSIN HCL 0.4 MG PO CAPS: 0.4000 mg | ORAL_CAPSULE | Freq: Every day | ORAL | 3 refills | Status: DC

## 2020-12-14 NOTE — Progress Notes (Signed)
12/14/2020 9:18 AM   Geoffrey Ramirez [birth year] [id number]  Referring provider: Idelle Crouch, MD [facility] [place],  [place] [address]  Chief Complaint  Patient presents with   Benign Prostatic Hypertrophy   Urologic history: 1.  BPH with LUTS -On tamsulosin/finasteride   2.  Chronic nonbacterial prostatitis -Improved on tamsulosin   3.  History gross hematuria -Evaluation Dr. Tresa Moore 2014 with CT/cystoscopy    4. History stone disease -Passed calculus 2014  5.  Erectile dysfunction   HPI: 60 y.o. male who presents for annual follow-up.  Doing well since last visit No bothersome LUTS Denies dysuria, gross hematuria Denies flank, abdominal or pelvic pain Is on vardenafil for ED and inquiring about other PDE 5 inhibitors that may have lower side effects   PMH: Past Medical History:  Diagnosis Date   Chronic prostatitis    Migraine headache    approx 1x/mo   Stroke ([place]) 12/25/2017   TIA (transient ischemic attack) 12/25/2017    Surgical History: Past Surgical History:  Procedure Laterality Date   ANTERIOR CRUCIATE LIGAMENT REPAIR     CHOLECYSTECTOMY     COLONOSCOPY WITH PROPOFOL N/A 04/14/2019   Procedure: COLONOSCOPY WITH BIOPSY;  Surgeon: Lucilla Lame, MD;  Location: [place];  Service: Endoscopy;  Laterality: N/A;   PATENT FORAMEN OVALE(PFO) CLOSURE  03/26/2018   POLYPECTOMY N/A 04/14/2019   Procedure: POLYPECTOMY;  Surgeon: Lucilla Lame, MD;  Location: [facility];  Service: Endoscopy;  Laterality: N/A;  One clip placed at site of Transverse Colon Polyp removal   TEE WITHOUT CARDIOVERSION N/A 01/22/2018   Procedure: TRANSESOPHAGEAL ECHOCARDIOGRAM (TEE);  Surgeon: Corey Skains, MD;  Location: [facility];  Service: Cardiovascular;  Laterality: N/A;    Home Medications:  Allergies as of 12/14/2020       Reactions   Shellfish Allergy Swelling   Angioedema        Medication List        Accurate  as of December 14, 2020  9:18 AM. If you have any questions, ask your nurse or doctor.          Align 4 MG Caps Take 4 mg by mouth daily.   amitriptyline 25 MG tablet Commonly known as: ELAVIL Take 25 mg by mouth at bedtime as needed for sleep.   aspirin EC 81 MG tablet Take 81 mg by mouth daily.   atorvastatin 40 MG tablet Commonly known as: LIPITOR Take 1 tablet (40 mg total) by mouth daily at 6 PM. What changed: how much to take   atorvastatin 20 MG tablet Commonly known as: LIPITOR Take 20 mg by mouth daily. What changed: Another medication with the same name was changed. Make sure you understand how and when to take each.   cetirizine 10 MG tablet Commonly known as: ZYRTEC Take 10 mg by mouth daily.   finasteride 5 MG tablet Commonly known as: PROSCAR TAKE 1 TABLET BY MOUTH EVERY DAY   multivitamin with minerals Tabs tablet Take 1 tablet by mouth daily.   propranolol 10 MG tablet Commonly known as: INDERAL Take 10 mg by mouth 2 (two) times daily.   SUMAtriptan 100 MG tablet Commonly known as: IMITREX Take 1 tablet (100 mg total) by mouth every 2 (two) hours as needed for migraine. Max 200mg  in 1 day   tamsulosin 0.4 MG Caps capsule Commonly known as: FLOMAX TAKE 1 CAPSULE BY MOUTH EVERY DAY   vardenafil 20 MG tablet Commonly known  as: LEVITRA TAKE 1 TABLET BY MOUTH EVERY DAY AS NEEDED   Vitamin D 50 MCG (2000 UT) Caps Take 2,000 Units by mouth daily.        Allergies:  Allergies  Allergen Reactions   Shellfish Allergy Swelling    Angioedema     Family History: Family History  Problem Relation Age of Onset   Cancer Mother    Mental illness Mother    Breast cancer Mother    Diabetes Father    Breast cancer Father    Cancer Maternal Grandmother    Diabetes Maternal Grandfather    Heart disease Maternal Grandfather    Stroke Maternal Grandfather    Glaucoma Maternal Grandfather     Social History:  reports that he has never smoked. He  has never used smokeless tobacco. He reports that he does not currently use alcohol. He reports that he does not use drugs.   Physical Exam: BP 115/78   Pulse 71   Ht 6' (1.829 m)   Wt 193 lb (87.5 kg)   BMI 26.18 kg/m   Constitutional:  Alert and oriented, No acute distress. HEENT: [place] AT, moist mucus membranes.  Trachea midline, no masses. Cardiovascular: No clubbing, cyanosis, or edema. Respiratory: Normal respiratory effort, no increased work of breathing. GU: Prostate 35 g, smooth without nodules Skin: No rashes, bruises or suspicious lesions. Neurologic: Grossly intact, no focal deficits, moving all 4 extremities. Psychiatric: Normal mood and affect.   Assessment & Plan:    1.  BPH with LUTS Stable on finasteride/tamsulosin.  Refills were sent to pharmacy  2.  Erectile dysfunction Regarding headache and flushing he was informed that tadalafil does have lower side effects and he desired a trial Tadalafil 20 mg Rx #10  3.  Prostate cancer screening Annual PSAs performed at his place of employment and he states it has been stable   Abbie Sons, [place] [address] [place], [place] [address] [phone]

## 2021-06-27 ENCOUNTER — Ambulatory Visit: Payer: BC Managed Care – PPO | Admitting: Physician Assistant

## 2021-06-27 ENCOUNTER — Encounter: Payer: Self-pay | Admitting: Physician Assistant

## 2021-06-27 VITALS — BP 149/88 | HR 71 | Ht 72.0 in | Wt 195.0 lb

## 2021-06-27 DIAGNOSIS — N5201 Erectile dysfunction due to arterial insufficiency: Secondary | ICD-10-CM

## 2021-06-27 MED ORDER — TADALAFIL 20 MG PO TABS: ORAL_TABLET | ORAL | 0 refills | Status: DC

## 2021-06-27 NOTE — Progress Notes (Signed)
? ?06/27/2021 ?4:19 PM  ? ?[name] ?26-Nov-1960 ?[id number] ? ?CC: ?Chief Complaint  ?Patient presents with  ? Follow-up  ? ?HPI: ?Geoffrey Ramirez is a 61 y.o. male with PMH BPH with LUTS on Flomax and finasteride, chronic prostatitis, gross hematuria, nephrolithiasis, and erectile dysfunction on vardenafil who presents today for vardenafil refill.  ? ?Today he reports his PCP used to prescribe his for Benefiel, however PCP retired and he would like for [place] to start filling this prescription.  Overall, he notes some bothersome stuffy nose and headache on this medication.  He was previously prescribed Cialis by Dr. Bernardo Heater, but did not fill it due to cost.  He would like to continue with demand dosing of PDE 5 inhibitors. ? ?PMH: ?Past Medical History:  ?Diagnosis Date  ? Chronic prostatitis   ? Migraine headache   ? approx 1x/mo  ? Stroke ([place]) 12/25/2017  ? TIA (transient ischemic attack) 12/25/2017  ? ? ?Surgical History: ?Past Surgical History:  ?Procedure Laterality Date  ? ANTERIOR CRUCIATE LIGAMENT REPAIR    ? CHOLECYSTECTOMY    ? COLONOSCOPY WITH PROPOFOL N/A 04/14/2019  ? Procedure: COLONOSCOPY WITH BIOPSY;  Surgeon: Lucilla Lame, MD;  Location: [facility];  Service: Endoscopy;  Laterality: N/A;  ? PATENT FORAMEN OVALE(PFO) CLOSURE  03/26/2018  ? POLYPECTOMY N/A 04/14/2019  ? Procedure: POLYPECTOMY;  Surgeon: Lucilla Lame, MD;  Location: [facility];  Service: Endoscopy;  Laterality: N/A;  One clip placed at site of Transverse Colon Polyp removal  ? TEE WITHOUT CARDIOVERSION N/A 01/22/2018  ? Procedure: TRANSESOPHAGEAL ECHOCARDIOGRAM (TEE);  Surgeon: Corey Skains, MD;  Location: [facility];  Service: Cardiovascular;  Laterality: N/A;  ? ? ?Home Medications:  ?Allergies as of 06/27/2021   ? ?   Reactions  ? Shellfish Allergy Swelling  ? Angioedema  ? ?  ? ?  ?Medication List  ?  ? ?  ? Accurate as of June 27, 2021  4:19 PM. If you have any questions, ask your nurse or doctor.  ?  ?  ? ?   ? ?STOP taking these medications   ? ?cetirizine 10 MG tablet ?Commonly known as: ZYRTEC ?Stopped by: Debroah Loop, PA-C ?  ?SUMAtriptan 100 MG tablet ?Commonly known as: IMITREX ?Stopped by: Debroah Loop, PA-C ?  ?vardenafil 20 MG tablet ?Commonly known as: LEVITRA ?Stopped by: Debroah Loop, PA-C ?  ? ?  ? ?TAKE these medications   ? ?Align 4 MG Caps ?Take 4 mg by mouth daily. ?  ?amitriptyline 25 MG tablet ?Commonly known as: ELAVIL ?Take 25 mg by mouth at bedtime as needed for sleep. ?  ?aspirin EC 81 MG tablet ?Take 81 mg by mouth daily. ?  ?atorvastatin 20 MG tablet ?Commonly known as: LIPITOR ?Take 20 mg by mouth daily. ?What changed: Another medication with the same name was removed. Continue taking this medication, and follow the directions you see here. ?Changed by: Debroah Loop, PA-C ?  ?finasteride 5 MG tablet ?Commonly known as: PROSCAR ?Take 1 tablet (5 mg total) by mouth daily. ?  ?multivitamin with minerals Tabs tablet ?Take 1 tablet by mouth daily. ?  ?propranolol 10 MG tablet ?Commonly known as: INDERAL ?Take 10 mg by mouth 2 (two) times daily. ?  ?tadalafil 20 MG tablet ?Commonly known as: CIALIS ?Take 1 tab 1 hour prior to intercourse ?  ?tamsulosin 0.4 MG Caps capsule ?Commonly known as: FLOMAX ?Take 1 capsule (0.4 mg total) by mouth daily. ?  ?Vitamin D  [place] (2000 UT) Caps ?Take 2,000 Units by mouth daily. ?  ? ?  ? ? ?Allergies:  ?Allergies  ?Allergen Reactions  ? Shellfish Allergy Swelling  ?  Angioedema ?  ? ? ?Family History: ?Family History  ?Problem Relation Age of Onset  ? Cancer Mother   ? Mental illness Mother   ? Breast cancer Mother   ? Diabetes Father   ? Breast cancer Father   ? Cancer Maternal Grandmother   ? Diabetes Maternal Grandfather   ? Heart disease Maternal Grandfather   ? Stroke Maternal Grandfather   ? Glaucoma Maternal Grandfather   ? ? ?Social History:  ? reports that he has never smoked. He has never used smokeless tobacco. He  reports that he does not currently use alcohol. He reports that he does not use drugs. ? ?Physical Exam: ?BP (!) 149/88   Pulse 71   Ht 6' (1.829 m)   Wt 195 lb (88.5 kg)   BMI 26.45 kg/m?   ?Constitutional:  Alert and oriented, no acute distress, nontoxic appearing ?HEENT: [place], AT ?Cardiovascular: No clubbing, cyanosis, or edema ?Respiratory: Normal respiratory effort, no increased work of breathing ?Skin: No rashes, bruises or suspicious lesions ?Neurologic: Grossly intact, no focal deficits, moving all 4 extremities ?Psychiatric: Normal mood and affect ? ?Assessment & Plan:   ?1. Erectile dysfunction due to arterial insufficiency ?We discussed pursuing a trial of Cialis as an alternative with fewer side effects.  We discussed other advantages of this medication including longer half-life and no impaired absorption due to fatty food intake.  We will send prescription to [facility] and coupon provided today to keep cost down.  Patient is in agreement with this plan. ?- tadalafil (CIALIS) 20 MG tablet; Take 1 tab 1 hour prior to intercourse  Dispense: 30 tablet; Refill: 0 ? ?Return if symptoms worsen or fail to improve. ? ?Debroah Loop, PA-C ? ?[facility] ?[address], [address] ?[place], [place] [address] ?[phone]   ?

## 2021-09-11 ENCOUNTER — Other Ambulatory Visit: Payer: Self-pay | Admitting: Physician Assistant

## 2021-09-11 DIAGNOSIS — N5201 Erectile dysfunction due to arterial insufficiency: Secondary | ICD-10-CM

## 2021-09-11 MED ORDER — VARDENAFIL HCL 20 MG PO TABS: 20.0000 mg | ORAL_TABLET | ORAL | 3 refills | Status: DC | PRN

## 2021-12-14 ENCOUNTER — Ambulatory Visit: Payer: BC Managed Care – PPO | Admitting: Urology

## 2021-12-15 ENCOUNTER — Ambulatory Visit: Payer: BC Managed Care – PPO | Admitting: Urology

## 2021-12-15 ENCOUNTER — Encounter: Payer: Self-pay | Admitting: Urology

## 2021-12-15 VITALS — BP 148/87 | HR 74 | Ht 72.0 in | Wt 192.0 lb

## 2021-12-15 DIAGNOSIS — N401 Enlarged prostate with lower urinary tract symptoms: Secondary | ICD-10-CM

## 2021-12-15 DIAGNOSIS — N5201 Erectile dysfunction due to arterial insufficiency: Secondary | ICD-10-CM

## 2021-12-15 LAB — BLADDER SCAN AMB NON-IMAGING: Scan Result: 12

## 2021-12-15 MED ORDER — FINASTERIDE 5 MG PO TABS: 5.0000 mg | ORAL_TABLET | Freq: Every day | ORAL | 3 refills | Status: DC

## 2021-12-15 MED ORDER — TAMSULOSIN HCL 0.4 MG PO CAPS: 0.4000 mg | ORAL_CAPSULE | Freq: Every day | ORAL | 3 refills | Status: DC

## 2021-12-15 NOTE — Progress Notes (Signed)
12/15/2021 9:55 AM   Geoffrey Ramirez [birth year] [id number]  Referring provider: Idelle Crouch, MD [facility] [place],   [address]  Chief Complaint  Patient presents with   Benign Prostatic Hypertrophy   Urologic history: 1.  BPH with LUTS -On tamsulosin/finasteride   2.  Chronic nonbacterial prostatitis -Improved on tamsulosin   3.  History gross hematuria -Evaluation Dr. Tresa Moore 2014 with CT/cystoscopy    4. History stone disease -Passed calculus 2014  5.  Erectile dysfunction   HPI: 61 y.o. male who presents for annual follow-up.  Doing well since last visit No bothersome LUTS; IPSS 7/35 Denies dysuria, gross hematuria Denies flank, abdominal or pelvic pain He had a PA visit in April 2023 and was given a trial of tadalafil 20 mg for ED which he did not feel was as effective as the vardenafil.  He is back on vardenafil but has not noted decreased efficacy He has good libido but does note tiredness and fatigue   PMH: Past Medical History:  Diagnosis Date   Chronic prostatitis    Migraine headache    approx 1x/mo   Stroke ([place]) 12/25/2017   TIA (transient ischemic attack) 12/25/2017    Surgical History: Past Surgical History:  Procedure Laterality Date   ANTERIOR CRUCIATE LIGAMENT REPAIR     CHOLECYSTECTOMY     COLONOSCOPY WITH PROPOFOL N/A 04/14/2019   Procedure: COLONOSCOPY WITH BIOPSY;  Surgeon: Lucilla Lame, MD;  Location: [place];  Service: Endoscopy;  Laterality: N/A;   PATENT FORAMEN OVALE(PFO) CLOSURE  03/26/2018   POLYPECTOMY N/A 04/14/2019   Procedure: POLYPECTOMY;  Surgeon: Lucilla Lame, MD;  Location: [place];  Service: Endoscopy;  Laterality: N/A;  One clip placed at site of Transverse Colon Polyp removal   TEE WITHOUT CARDIOVERSION N/A 01/22/2018   Procedure: TRANSESOPHAGEAL ECHOCARDIOGRAM (TEE);  Surgeon: Corey Skains, MD;  Location: [facility];  Service: Cardiovascular;   Laterality: N/A;    Home Medications:  Allergies as of 12/15/2021       Reactions   Shellfish Allergy Swelling   Angioedema        Medication List        Accurate as of December 15, 2021  9:55 AM. If you have any questions, ask your nurse or doctor.          Align 4 MG Caps Take 4 mg by mouth daily.   amitriptyline 25 MG tablet Commonly known as: ELAVIL Take 25 mg by mouth at bedtime as needed for sleep.   aspirin EC 81 MG tablet Take 81 mg by mouth daily.   atorvastatin 20 MG tablet Commonly known as: LIPITOR Take 20 mg by mouth daily.   finasteride 5 MG tablet Commonly known as: PROSCAR Take 1 tablet (5 mg total) by mouth daily.   multivitamin with minerals Tabs tablet Take 1 tablet by mouth daily.   propranolol 10 MG tablet Commonly known as: INDERAL Take 10 mg by mouth 2 (two) times daily.   tamsulosin 0.4 MG Caps capsule Commonly known as: FLOMAX Take 1 capsule (0.4 mg total) by mouth daily.   vardenafil 20 MG tablet Commonly known as: Levitra Take 1 tablet (20 mg total) by mouth as needed for erectile dysfunction.   Vitamin D 50 MCG (2000 UT) Caps Take 2,000 Units by mouth daily.        Allergies:  Allergies  Allergen Reactions   Shellfish Allergy Swelling    Angioedema  Family History: Family History  Problem Relation Age of Onset   Cancer Mother    Mental illness Mother    Breast cancer Mother    Diabetes Father    Breast cancer Father    Cancer Maternal Grandmother    Diabetes Maternal Grandfather    Heart disease Maternal Grandfather    Stroke Maternal Grandfather    Glaucoma Maternal Grandfather     Social History:  reports that he has never smoked. He has never used smokeless tobacco. He reports that he does not currently use alcohol. He reports that he does not use drugs.   Physical Exam: BP (!) 148/87   Pulse 74   Ht 6' (1.829 m)   Wt 192 lb (87.1 kg)   BMI 26.04 kg/m   Constitutional:  Alert and oriented,  No acute distress. HEENT: [place] AT, moist mucus membranes.  Trachea midline, no masses. Cardiovascular: No clubbing, cyanosis, or edema. Respiratory: Normal respiratory effort, no increased work of breathing. GU: Prostate 35 g, smooth without nodules Skin: No rashes, bruises or suspicious lesions. Neurologic: Grossly intact, no focal deficits, moving all 4 extremities. Psychiatric: Normal mood and affect.   Assessment & Plan:    1.  BPH with LUTS Stable on finasteride/tamsulosin.  Refills were sent to pharmacy PVR 12 mL  2.  Erectile dysfunction Vardenafil not as effective over the last 12 months Does have some tiredness and fatigue and a testosterone/LH was drawn  3.  Prostate cancer screening Annual PSAs performed at his place of employment and he states it has been stable   Abbie Sons, [place] [address] [place], [place] [address] [phone]

## 2021-12-16 LAB — TESTOSTERONE: Testosterone: 639 ng/dL (ref 264–916)

## 2021-12-16 LAB — LUTEINIZING HORMONE: LH: 9.8 m[IU]/mL — ABNORMAL HIGH (ref 1.7–8.6)

## 2021-12-18 ENCOUNTER — Telehealth: Payer: Self-pay | Admitting: *Deleted

## 2021-12-18 NOTE — Telephone Encounter (Signed)
-----   Message from Abbie Sons, MD sent at 12/17/2021  3:13 PM EDT ----- Testosterone level was normal.  He had worsening ED on oral medication.  Can schedule appointment with Larene Beach to discuss intracavernosal injections if he desires

## 2021-12-19 ENCOUNTER — Telehealth: Payer: Self-pay | Admitting: *Deleted

## 2021-12-19 NOTE — Telephone Encounter (Signed)
Notified patient as instructed, Discussed follow-up appointments with shannon . Made appt.  patient agrees

## 2021-12-26 NOTE — Progress Notes (Unsigned)
12/27/2021 1:40 PM   Geoffrey Ramirez December 09, 1960 [id number]  Referring provider: Idelle Crouch, MD [facility] [place],  [place] [address]  Urological history: 1.  BPH with LUTS -On tamsulosin/finasteride   2.  Chronic nonbacterial prostatitis -Improved on tamsulosin   3.  History gross hematuria -Evaluation Dr. Tresa Moore 2014 with CT/cystoscopy    4. History stone disease -Passed calculus 2014   5.  Erectile dysfunction   Chief Complaint  Patient presents with   Erectile Dysfunction    HPI: Geoffrey Ramirez is a 61 y.o. male who presents today for to discuss ICI.    He had been having success with PDE5i's until recently.  His best response was to Levitra, but it was starting to lose its effectiveness.  He then had a trial of tadalafil which he felt was not helpful.  He is interested in more discussions for treatment options.  His recent testosterone level was 659.  Patient denies any modifying or aggravating factors.  Patient denies any gross hematuria, dysuria or suprapubic/flank pain.  Patient denies any fevers, chills, nausea or vomiting.    Patient still having spontaneous erections.  He denies any pain or curvature with erections.     PMH: Past Medical History:  Diagnosis Date   Chronic prostatitis    Migraine headache    approx 1x/mo   Stroke ([place]) 12/25/2017   TIA (transient ischemic attack) 12/25/2017    Surgical History: Past Surgical History:  Procedure Laterality Date   ANTERIOR CRUCIATE LIGAMENT REPAIR     CHOLECYSTECTOMY     COLONOSCOPY WITH PROPOFOL N/A 04/14/2019   Procedure: COLONOSCOPY WITH BIOPSY;  Surgeon: Lucilla Lame, MD;  Location: [place];  Service: Endoscopy;  Laterality: N/A;   PATENT FORAMEN OVALE(PFO) CLOSURE  03/26/2018   POLYPECTOMY N/A 04/14/2019   Procedure: POLYPECTOMY;  Surgeon: Lucilla Lame, MD;  Location: [place];  Service: Endoscopy;  Laterality: N/A;  One clip placed at  site of Transverse Colon Polyp removal   TEE WITHOUT CARDIOVERSION N/A 01/22/2018   Procedure: TRANSESOPHAGEAL ECHOCARDIOGRAM (TEE);  Surgeon: Corey Skains, MD;  Location: [facility];  Service: Cardiovascular;  Laterality: N/A;    Home Medications:  Allergies as of 12/27/2021       Reactions   Shellfish Allergy Swelling   Angioedema        Medication List        Accurate as of December 27, 2021  1:40 PM. If you have any questions, ask your nurse or doctor.          STOP taking these medications    vardenafil 20 MG tablet Commonly known as: Levitra       TAKE these medications    Align 4 MG Caps Take 4 mg by mouth daily.   amitriptyline 25 MG tablet Commonly known as: ELAVIL Take 25 mg by mouth at bedtime as needed for sleep.   aspirin EC 81 MG tablet Take 81 mg by mouth daily.   atorvastatin 20 MG tablet Commonly known as: LIPITOR Take 20 mg by mouth daily.   finasteride 5 MG tablet Commonly known as: PROSCAR Take 1 tablet (5 mg total) by mouth daily.   multivitamin with minerals Tabs tablet Take 1 tablet by mouth daily.   propranolol 10 MG tablet Commonly known as: INDERAL Take 10 mg by mouth 2 (two) times daily.   sildenafil 100 MG tablet Commonly known as: VIAGRA Take 1 tablet (100 mg total)  by mouth daily as needed for erectile dysfunction. Take two hours prior to intercourse on an empty stomach   tamsulosin 0.4 MG Caps capsule Commonly known as: FLOMAX Take 1 capsule (0.4 mg total) by mouth daily.   Vitamin D 50 MCG (2000 UT) Caps Take 2,000 Units by mouth daily.        Allergies:  Allergies  Allergen Reactions   Shellfish Allergy Swelling    Angioedema     Family History: Family History  Problem Relation Age of Onset   Cancer Mother    Mental illness Mother    Breast cancer Mother    Diabetes Father    Breast cancer Father    Cancer Maternal Grandmother    Diabetes Maternal Grandfather    Heart disease Maternal  Grandfather    Stroke Maternal Grandfather    Glaucoma Maternal Grandfather     Social History:  reports that he has never smoked. He has never used smokeless tobacco. He reports that he does not currently use alcohol. He reports that he does not use drugs.  ROS: Pertinent ROS in HPI  Physical Exam: BP (!) 141/87   Pulse 89   Ht 6' (1.829 m)   Wt 197 lb (89.4 kg)   BMI 26.72 kg/m   Constitutional:  Well nourished. Alert and oriented, No acute distress. HEENT: [place] AT, moist mucus membranes.  Trachea midline Cardiovascular: No clubbing, cyanosis, or edema. Respiratory: Normal respiratory effort, no increased work of breathing. Neurologic: Grossly intact, no focal deficits, moving all 4 extremities. Psychiatric: Normal mood and affect.  Laboratory Data: Lab Results  Component Value Date   TESTOSTERONE 639 12/15/2021  I have reviewed the labs.   Pertinent Imaging: N/A  Assessment & Plan:    1. ED - We discussed trying a different PDE5 inhibitor, intracavernous vasoactive drug injection therapy, LI-ESWT and penile prosthesis implantation  -He would like to try sildenafil 100 mg on-demand dosing at this time -His insurance does seem to have Caverject on his formulary and may be covered with a prior approval, so if he finds the sildenafil not effective he may consider Caverject and as we have Caverject onsite we will go ahead and schedule titration with Caverject if he so chooses -He is also interested in LI-ES WT, so I have given the phone number to [facility] in [place] so that he can call and make an appointment to discuss that further  Return in about 1 year (around 12/28/2022) for IPSS, SHIM and exam w/ Dr. Bernardo Heater .  These notes generated with voice recognition software. I apologize for typographical errors.  Royden Purl  [place] [address]  [place], [place] [address] [phone]   I spent 30 minutes  on the day of the encounter to include pre-visit record review and face-to-face time with the patient

## 2021-12-27 ENCOUNTER — Ambulatory Visit: Payer: BC Managed Care – PPO | Admitting: Urology

## 2021-12-27 ENCOUNTER — Encounter: Payer: Self-pay | Admitting: Urology

## 2021-12-27 VITALS — BP 141/87 | HR 89 | Ht 72.0 in | Wt 197.0 lb

## 2021-12-27 DIAGNOSIS — N5201 Erectile dysfunction due to arterial insufficiency: Secondary | ICD-10-CM | POA: Diagnosis not present

## 2021-12-27 MED ORDER — SILDENAFIL CITRATE 100 MG PO TABS: 100.0000 mg | ORAL_TABLET | Freq: Every day | ORAL | 3 refills | Status: DC | PRN

## 2021-12-27 NOTE — Patient Instructions (Signed)
[facility] [phone]

## 2022-01-17 ENCOUNTER — Ambulatory Visit: Payer: BC Managed Care – PPO | Admitting: Urology

## 2022-04-23 DIAGNOSIS — E538 Deficiency of other specified B group vitamins: Secondary | ICD-10-CM | POA: Insufficient documentation

## 2022-08-23 ENCOUNTER — Other Ambulatory Visit: Payer: Self-pay | Admitting: Internal Medicine

## 2022-08-23 DIAGNOSIS — R079 Chest pain, unspecified: Secondary | ICD-10-CM

## 2022-08-29 ENCOUNTER — Telehealth (HOSPITAL_COMMUNITY): Payer: Self-pay | Admitting: *Deleted

## 2022-08-29 ENCOUNTER — Encounter (HOSPITAL_COMMUNITY): Payer: Self-pay

## 2022-08-29 ENCOUNTER — Other Ambulatory Visit: Payer: Self-pay

## 2022-08-29 MED ORDER — IVABRADINE HCL 7.5 MG PO TABS
ORAL_TABLET | ORAL | 0 refills | Status: DC
  Filled 2022-08-29: qty 2, 1d supply, fill #0

## 2022-08-29 MED ORDER — METOPROLOL TARTRATE 100 MG PO TABS
ORAL_TABLET | ORAL | 0 refills | Status: DC
  Filled 2022-08-29: qty 1, 1d supply, fill #0

## 2022-08-29 NOTE — Telephone Encounter (Signed)
Reaching out to patient to offer assistance regarding upcoming cardiac imaging study; pt verbalizes understanding of appt date/time, parking situation and where to check in, pre-test NPO status and medications ordered, and verified current allergies; name and call back number provided for further questions should they arise  Leslie Langille RN Navigator Cardiac Imaging [facility] [phone] office [phone] cell  Patient to take 100mg metoprolol tartrate and 15mg ivabradine two hours prior to his cardiac CT scan. 

## 2022-08-30 ENCOUNTER — Ambulatory Visit
Admission: RE | Admit: 2022-08-30 | Discharge: 2022-08-30 | Disposition: A | Discharge status: Home / Self Care | Payer: BC Managed Care – PPO | Source: Ambulatory Visit | Attending: Internal Medicine | Admitting: Internal Medicine

## 2022-08-30 DIAGNOSIS — R079 Chest pain, unspecified: Secondary | ICD-10-CM

## 2022-08-30 MED ORDER — IOHEXOL 350 MG/ML SOLN
75.0000 mL | Freq: Once | INTRAVENOUS | Status: AC | PRN
  Administered 2022-08-30: 75 mL via INTRAVENOUS

## 2022-08-30 MED ORDER — METOPROLOL TARTRATE 5 MG/5ML IV SOLN: 10.0000 mg | Freq: Once | INTRAVENOUS | Status: DC

## 2022-08-30 MED ORDER — METOPROLOL TARTRATE 5 MG/5ML IV SOLN
5.0000 mg | Freq: Once | INTRAVENOUS | Status: AC
  Administered 2022-08-30: 5 mg via INTRAVENOUS

## 2022-08-30 MED ORDER — SODIUM CHLORIDE 0.9 % IV BOLUS
250.0000 mL | Freq: Once | INTRAVENOUS | Status: AC
  Administered 2022-08-30: 150 mL via INTRAVENOUS

## 2022-08-30 MED ORDER — DILTIAZEM HCL 25 MG/5ML IV SOLN
5.0000 mg | Freq: Once | INTRAVENOUS | Status: AC
  Administered 2022-08-30: 5 mg via INTRAVENOUS

## 2022-08-30 MED ORDER — NITROGLYCERIN 0.4 MG SL SUBL
0.8000 mg | SUBLINGUAL_TABLET | Freq: Once | SUBLINGUAL | Status: AC
  Administered 2022-08-30: 0.8 mg via SUBLINGUAL

## 2022-08-30 NOTE — Progress Notes (Signed)
Patient tolerated CT well. Drank water after. Vital signs stable encourage to drink water throughout day.Reasons explained and verbalized understanding. Ambulated steady gait.  

## 2022-09-20 ENCOUNTER — Inpatient Hospital Stay: Admission: RE | Admit: 2022-09-20 | Payer: BC Managed Care – PPO | Source: Ambulatory Visit

## 2023-01-02 ENCOUNTER — Ambulatory Visit (INDEPENDENT_AMBULATORY_CARE_PROVIDER_SITE_OTHER): Payer: Self-pay | Admitting: Urology

## 2023-01-02 ENCOUNTER — Encounter: Payer: Self-pay | Admitting: Urology

## 2023-01-02 VITALS — BP 144/80 | HR 74 | Ht 72.0 in | Wt 193.0 lb

## 2023-01-02 DIAGNOSIS — N401 Enlarged prostate with lower urinary tract symptoms: Secondary | ICD-10-CM

## 2023-01-02 DIAGNOSIS — N5201 Erectile dysfunction due to arterial insufficiency: Secondary | ICD-10-CM

## 2023-01-02 DIAGNOSIS — Z125 Encounter for screening for malignant neoplasm of prostate: Secondary | ICD-10-CM

## 2023-01-02 LAB — BLADDER SCAN AMB NON-IMAGING: PVR: 0 WU

## 2023-01-02 NOTE — Progress Notes (Signed)
I, Geoffrey Ramirez, acting as a scribe for Riki Altes, MD., have documented all relevant documentation on the behalf of Riki Altes, MD, as directed by Riki Altes, MD while in the presence of Riki Altes, MD.  01/02/2023 5:13 PM   [name] 08/19/1960 [id number]  Referring provider: Marguarite Arbour, MD [address] [facility] [place],  [place] [address]  Chief Complaint  Patient presents with   Benign Prostatic Hypertrophy   Urologic history: 1.  BPH with LUTS On tamsulosin/finasteride   2.  Chronic nonbacterial prostatitis Improved on tamsulosin   3.  History gross hematuria Evaluation Dr. Berneice Heinrich 2014 with CT/cystoscopy    4. History stone disease Passed calculus 2014   5.  Erectile dysfunction  HPI: Geoffrey Ramirez is a 62 y.o. male presents for annual follow-up.   Doing well since last visit No bothersome LUTS; IPSS 4/35 Denies dysuria, gross hematuria Denies flank, abdominal or pelvic pain He did see Michiel Cowboy a few weeks after our visit last year to discuss intracavernosal injections and ultimately decided on a trial of on-demand sildenafil 100 mg, which he feels does work better than previous PDE5 inhibitors.  Remains on tamsulosin.   PMH: Past Medical History:  Diagnosis Date   Chronic prostatitis    Migraine headache    approx 1x/mo   Stroke ([facility]) 12/25/2017   TIA (transient ischemic attack) 12/25/2017    Surgical History: Past Surgical History:  Procedure Laterality Date   ANTERIOR CRUCIATE LIGAMENT REPAIR     CHOLECYSTECTOMY     COLONOSCOPY WITH PROPOFOL N/A 04/14/2019   Procedure: COLONOSCOPY WITH BIOPSY;  Surgeon: Midge Minium, MD;  Location: [facility];  Service: Endoscopy;  Laterality: N/A;   PATENT FORAMEN OVALE(PFO) CLOSURE  03/26/2018   POLYPECTOMY N/A 04/14/2019   Procedure: POLYPECTOMY;  Surgeon: Midge Minium, MD;  Location: [facility];  Service: Endoscopy;  Laterality: N/A;   One clip placed at site of Transverse Colon Polyp removal   TEE WITHOUT CARDIOVERSION N/A 01/22/2018   Procedure: TRANSESOPHAGEAL ECHOCARDIOGRAM (TEE);  Surgeon: Lamar Blinks, MD;  Location: [facility];  Service: Cardiovascular;  Laterality: N/A;    Home Medications:  Allergies as of 01/02/2023       Reactions   Shellfish Allergy Swelling   Angioedema        Medication List        Accurate as of January 02, 2023  5:13 PM. If you have any questions, ask your nurse or doctor.          STOP taking these medications    Corlanor 7.5 MG Tabs tablet Generic drug: ivabradine Stopped by: Riki Altes   metoprolol tartrate 100 MG tablet Commonly known as: LOPRESSOR Stopped by: Riki Altes       TAKE these medications    Align 4 MG Caps Take 4 mg by mouth daily.   amitriptyline 25 MG tablet Commonly known as: ELAVIL Take 25 mg by mouth at bedtime as needed for sleep.   aspirin EC 81 MG tablet Take 81 mg by mouth daily.   atorvastatin 20 MG tablet Commonly known as: LIPITOR Take 20 mg by mouth daily.   finasteride 5 MG tablet Commonly known as: PROSCAR Take 1 tablet (5 mg total) by mouth daily.   multivitamin with minerals Tabs tablet Take 1 tablet by mouth daily.   propranolol 10 MG tablet Commonly known as: INDERAL Take 10 mg by mouth  2 (two) times daily.   sildenafil 100 MG tablet Commonly known as: VIAGRA Take 1 tablet (100 mg total) by mouth daily as needed for erectile dysfunction. Take two hours prior to intercourse on an empty stomach   tamsulosin 0.4 MG Caps capsule Commonly known as: FLOMAX Take 1 capsule (0.4 mg total) by mouth daily.   Vitamin D 50 MCG (2000 UT) Caps Take 2,000 Units by mouth daily.        Allergies:  Allergies  Allergen Reactions   Shellfish Allergy Swelling    Angioedema     Family History: Family History  Problem Relation Age of Onset   Cancer Mother    Mental illness Mother    Breast cancer  Mother    Diabetes Father    Breast cancer Father    Cancer Maternal Grandmother    Diabetes Maternal Grandfather    Heart disease Maternal Grandfather    Stroke Maternal Grandfather    Glaucoma Maternal Grandfather     Social History:  reports that he has never smoked. He has never used smokeless tobacco. He reports that he does not currently use alcohol. He reports that he does not use drugs.   Physical Exam: BP (!) 144/80   Pulse 74   Ht 6' (1.829 m)   Wt 193 lb (87.5 kg)   BMI 26.18 kg/m   Constitutional:  Alert and oriented, No acute distress. HEENT: [place] AT, moist mucus membranes.  Trachea midline, no masses. Cardiovascular: No clubbing, cyanosis, or edema. Respiratory: Normal respiratory effort, no increased work of breathing. GI: Abdomen is soft, nontender, nondistended, no abdominal masses Skin: No rashes, bruises or suspicious lesions. Neurologic: Grossly intact, no focal deficits, moving all 4 extremities. Psychiatric: Normal mood and affect.   Assessment & Plan:    1. BPH with LUTS  Stable on finasteride/tamsulosin.  Refills were sent to pharmacy PVR 0 mL  2. Erectile dysfunction Sildenafil 100 mg more effective than prior PDE5 inhibitors, which he will continue.   3. Prostate cancer screening He states his PSA August 2024 was 0.2 We discussed recommendations of prostate cancer are PSA and DRE through age 32. We discussed approximately 5% of patients will have a normal PSA and abnormal DRE. Indicated he would like to have his DRE performed, but due to hemorrhoid flare, we'll wait until next year.  [facility] [address], [address] [place], [place] [address] [phone]

## 2023-04-20 ENCOUNTER — Other Ambulatory Visit: Payer: Self-pay | Admitting: Urology

## 2023-05-25 ENCOUNTER — Other Ambulatory Visit: Payer: Self-pay | Admitting: Urology

## 2023-09-19 ENCOUNTER — Other Ambulatory Visit: Payer: Self-pay | Admitting: Urology

## 2023-10-14 ENCOUNTER — Ambulatory Visit (INDEPENDENT_AMBULATORY_CARE_PROVIDER_SITE_OTHER): Admitting: Urology

## 2023-10-14 ENCOUNTER — Encounter: Payer: Self-pay | Admitting: Urology

## 2023-10-14 VITALS — BP 145/84 | HR 80 | Ht 72.0 in | Wt 195.0 lb

## 2023-10-14 DIAGNOSIS — N401 Enlarged prostate with lower urinary tract symptoms: Secondary | ICD-10-CM

## 2023-10-14 DIAGNOSIS — N5201 Erectile dysfunction due to arterial insufficiency: Secondary | ICD-10-CM | POA: Diagnosis not present

## 2023-10-14 LAB — BLADDER SCAN AMB NON-IMAGING: Scan Result: 44

## 2023-10-14 MED ORDER — SILDENAFIL CITRATE 100 MG PO TABS
100.0000 mg | ORAL_TABLET | Freq: Every day | ORAL | 3 refills | Status: AC | PRN
Start: 2023-10-14 — End: ?

## 2023-10-14 NOTE — Progress Notes (Signed)
 10/14/2023 1:45 PM   Geoffrey Ramirez 07-15-1960 [id number]  Referring provider: Auston Reyes BIRCH, MD [address] [facility] [place],  [place] [address]  Chief Complaint  Patient presents with   Erectile Dysfunction   Urologic history: 1.  BPH with LUTS On tamsulosin /finasteride    2.  Chronic nonbacterial prostatitis Improved on tamsulosin    3.  History gross hematuria Evaluation Dr. Alvaro 2014 with CT/cystoscopy    4. History stone disease Passed calculus 2014   5.  Erectile dysfunction Sildenafil  100 mg   HPI: Geoffrey Ramirez is a 63 y.o. male presents for annual follow-up.   Doing well since last visit No bothersome LUTS Denies dysuria, gross hematuria Denies flank, abdominal or pelvic pain Remains on tamsulosin , finasteride  and sildenafil . Scheduled for PSA next week with Dr. Auston   PMH: Past Medical History:  Diagnosis Date   Chronic prostatitis    Migraine headache    approx 1x/mo   Stroke ([facility]) 12/25/2017   TIA (transient ischemic attack) 12/25/2017    Surgical History: Past Surgical History:  Procedure Laterality Date   ANTERIOR CRUCIATE LIGAMENT REPAIR     CHOLECYSTECTOMY     COLONOSCOPY WITH PROPOFOL  N/A 04/14/2019   Procedure: COLONOSCOPY WITH BIOPSY;  Surgeon: Jinny Carmine, MD;  Location: [facility];  Service: Endoscopy;  Laterality: N/A;   PATENT FORAMEN OVALE(PFO) CLOSURE  03/26/2018   POLYPECTOMY N/A 04/14/2019   Procedure: POLYPECTOMY;  Surgeon: Jinny Carmine, MD;  Location: [facility];  Service: Endoscopy;  Laterality: N/A;  One clip placed at site of Transverse Colon Polyp removal   TEE WITHOUT CARDIOVERSION N/A 01/22/2018   Procedure: TRANSESOPHAGEAL ECHOCARDIOGRAM (TEE);  Surgeon: Hester Wolm PARAS, MD;  Location: [facility];  Service: Cardiovascular;  Laterality: N/A;    Home Medications:  Allergies as of 10/14/2023       Reactions   Shellfish Allergy Swelling   Angioedema        Medication List         Accurate as of October 14, 2023  1:45 PM. If you have any questions, ask your nurse or doctor.          Align 4 MG Caps Take 4 mg by mouth daily.   amitriptyline 25 MG tablet Commonly known as: ELAVIL Take 25 mg by mouth at bedtime as needed for sleep.   aspirin  EC 81 MG tablet Take 81 mg by mouth daily.   atorvastatin  20 MG tablet Commonly known as: LIPITOR Take 20 mg by mouth daily.   finasteride  5 MG tablet Commonly known as: PROSCAR  TAKE 1 TABLET (5 MG TOTAL) BY MOUTH DAILY.   FISH OIL PO Take by mouth.   multivitamin with minerals Tabs tablet Take 1 tablet by mouth daily.   propranolol 10 MG tablet Commonly known as: INDERAL Take 10 mg by mouth 2 (two) times daily.   sildenafil  100 MG tablet Commonly known as: VIAGRA  Take 1 tablet (100 mg total) by mouth daily as needed for erectile dysfunction. Take two hours prior to intercourse on an empty stomach   tamsulosin  0.4 MG Caps capsule Commonly known as: FLOMAX  TAKE 1 CAPSULE BY MOUTH EVERY DAY   Vitamin D  50 MCG (2000 UT) Caps Take 2,000 Units by mouth daily.        Allergies:  Allergies  Allergen Reactions   Shellfish Allergy Swelling    Angioedema     Family History: Family History  Problem Relation Age of Onset   Cancer  Mother    Mental illness Mother    Breast cancer Mother    Diabetes Father    Breast cancer Father    Cancer Maternal Grandmother    Diabetes Maternal Grandfather    Heart disease Maternal Grandfather    Stroke Maternal Grandfather    Glaucoma Maternal Grandfather     Social History:  reports that he has never smoked. He has never used smokeless tobacco. He reports that he does not currently use alcohol. He reports that he does not use drugs.   Physical Exam: BP (!) 145/84   Pulse 80   Ht 6' (1.829 m)   Wt 195 lb (88.5 kg)   BMI 26.45 kg/m   Constitutional:  Alert and oriented, No acute distress. HEENT: [place] AT Respiratory: Normal respiratory effort, no  increased work of breathing. GU: Prostate 35 g smooth without nodules Psychiatric: Normal mood and affect.   Assessment & Plan:    1. BPH with LUTS  Stable on finasteride /tamsulosin .  Refills were sent to pharmacy PVR 44 mL  2. Erectile dysfunction Sildenafil  100 mg refilled  3. Prostate cancer screening Benign DRE PSA pending  Continue annual follow-up  [name], MD  [facility] [address] [place], [place] [address] [phone]

## 2023-10-21 ENCOUNTER — Encounter: Payer: Self-pay | Admitting: Urology

## 2024-01-02 ENCOUNTER — Ambulatory Visit: Payer: Self-pay | Admitting: Urology

## 2024-10-14 ENCOUNTER — Ambulatory Visit: Admitting: Urology
